# Patient Record
Sex: Female | Born: 1975 | Race: Black or African American | Hispanic: No | Marital: Married | Smoking: Never smoker
Health system: Southern US, Community
[De-identification: ages and names within clinical notes are randomized; demographics above are authoritative.]

## PROBLEM LIST (undated history)

## (undated) DIAGNOSIS — D329 Benign neoplasm of meninges, unspecified: Secondary | ICD-10-CM

## (undated) DIAGNOSIS — K219 Gastro-esophageal reflux disease without esophagitis: Secondary | ICD-10-CM

## (undated) DIAGNOSIS — E119 Type 2 diabetes mellitus without complications: Secondary | ICD-10-CM

## (undated) DIAGNOSIS — G8929 Other chronic pain: Secondary | ICD-10-CM

## (undated) DIAGNOSIS — R519 Headache, unspecified: Secondary | ICD-10-CM

## (undated) DIAGNOSIS — I10 Essential (primary) hypertension: Secondary | ICD-10-CM

## (undated) HISTORY — PX: TUBAL LIGATION: SHX77

## (undated) HISTORY — PX: THYROIDECTOMY, PARTIAL: SHX18

---

## 2009-09-22 DIAGNOSIS — E119 Type 2 diabetes mellitus without complications: Secondary | ICD-10-CM | POA: Insufficient documentation

## 2009-09-22 DIAGNOSIS — E890 Postprocedural hypothyroidism: Secondary | ICD-10-CM | POA: Insufficient documentation

## 2009-09-22 DIAGNOSIS — E049 Nontoxic goiter, unspecified: Secondary | ICD-10-CM | POA: Insufficient documentation

## 2011-10-15 DIAGNOSIS — E890 Postprocedural hypothyroidism: Secondary | ICD-10-CM | POA: Insufficient documentation

## 2011-10-15 DIAGNOSIS — Z6837 Body mass index (BMI) 37.0-37.9, adult: Secondary | ICD-10-CM | POA: Insufficient documentation

## 2011-10-15 DIAGNOSIS — E119 Type 2 diabetes mellitus without complications: Secondary | ICD-10-CM | POA: Insufficient documentation

## 2011-10-15 DIAGNOSIS — G43909 Migraine, unspecified, not intractable, without status migrainosus: Secondary | ICD-10-CM | POA: Insufficient documentation

## 2012-02-18 DIAGNOSIS — G4700 Insomnia, unspecified: Secondary | ICD-10-CM | POA: Insufficient documentation

## 2012-03-19 DIAGNOSIS — N971 Female infertility of tubal origin: Secondary | ICD-10-CM | POA: Insufficient documentation

## 2012-03-19 DIAGNOSIS — E2839 Other primary ovarian failure: Secondary | ICD-10-CM | POA: Insufficient documentation

## 2015-08-18 ENCOUNTER — Emergency Department: Admit: 2015-08-19 | Payer: BLUE CROSS/BLUE SHIELD | Primary: Family Medicine

## 2015-08-18 ENCOUNTER — Inpatient Hospital Stay
Admit: 2015-08-18 | Discharge: 2015-08-19 | Disposition: A | Discharge status: Home / Self Care | Payer: BLUE CROSS/BLUE SHIELD | Attending: Student in an Organized Health Care Education/Training Program

## 2015-08-18 DIAGNOSIS — K529 Noninfective gastroenteritis and colitis, unspecified: Secondary | ICD-10-CM

## 2015-08-18 LAB — HCG URINE, QL. - POC: Pregnancy test,urine (POC): NEGATIVE

## 2015-08-18 MED ORDER — SODIUM CHLORIDE 0.9% BOLUS IV
0.9 % | Freq: Once | INTRAVENOUS | Status: AC
Start: 2015-08-18 — End: 2015-08-18
  Administered 2015-08-18: via INTRAVENOUS

## 2015-08-18 MED ORDER — FAMOTIDINE (PF) 20 MG/2 ML IV
20 mg/2 mL | INTRAVENOUS | Status: AC
Start: 2015-08-18 — End: 2015-08-18
  Administered 2015-08-19: via INTRAVENOUS

## 2015-08-18 MED ORDER — KETOROLAC TROMETHAMINE 30 MG/ML INJECTION
30 mg/mL (1 mL) | INTRAMUSCULAR | Status: AC
Start: 2015-08-18 — End: 2015-08-18
  Administered 2015-08-19: via INTRAVENOUS

## 2015-08-18 MED ORDER — ONDANSETRON (PF) 4 MG/2 ML INJECTION
4 mg/2 mL | INTRAMUSCULAR | Status: AC
Start: 2015-08-18 — End: 2015-08-18
  Administered 2015-08-19: via INTRAVENOUS

## 2015-08-18 MED FILL — ONDANSETRON (PF) 4 MG/2 ML INJECTION: 4 mg/2 mL | INTRAMUSCULAR | Qty: 2

## 2015-08-18 MED FILL — FAMOTIDINE (PF) 20 MG/2 ML IV: 20 mg/2 mL | INTRAVENOUS | Qty: 2

## 2015-08-18 MED FILL — KETOROLAC TROMETHAMINE 30 MG/ML INJECTION: 30 mg/mL (1 mL) | INTRAMUSCULAR | Qty: 1

## 2015-08-18 MED FILL — SODIUM CHLORIDE 0.9 % IV: INTRAVENOUS | Qty: 1000

## 2015-08-18 NOTE — ED Notes (Signed)
I have reviewed discharge instructions with the patient.  The patient verbalized understanding. Time allotted for questions. VSS. Pt ambulatory out of unit with family member.

## 2015-08-18 NOTE — ED Notes (Signed)
Pt ambulatory to restroom with steady gait.

## 2015-08-18 NOTE — ED Triage Notes (Signed)
Pt arrives with abd pain, n/v x 1.5 weeks. [facility] & [facility] to home but pt has gotten no relief.  No diarrhea. Denies fever.

## 2015-08-18 NOTE — ED Provider Notes (Signed)
HPI Comments: The pt is a 40 year old female with a history of DM and HTN who presents with complaints of nausea, vomiting, and LUQ pain for the last 1.5 weeks. Seen at [facility] - normal laboratory data - discharged with Omeprazole. Pt denies improvement in her symptoms. Pt denies fevers, chills, night sweats, chest pressure, SOB, DOE, PND, orthopnea, melena, hematuria, dysuria, constipation, HA, dizziness, and syncope.       Past Medical History:    Diabetes type 2, controlled (HCC)                             H/O partial thyroidectomy                                     Hypertension                                                  Past Surgical History:    HX PARTIAL THYROIDECTOMY                                       PCP:  Sarah Ridgel, MD        Patient is a 40 y.o. female presenting with abdominal pain and nausea. The history is provided by the patient.   Abdominal Pain    This is a new problem. Episode onset: 1.5 weeks ago. The problem occurs constantly. The problem has been gradually worsening. Associated with: n/v. The pain is located in the LUQ. The quality of the pain is shooting. The pain is at a severity of 10/10. The pain is moderate. Associated symptoms include nausea, vomiting and chest pain. Pertinent negatives include no anorexia, no fever, no belching, no diarrhea, no flatus, no hematochezia, no melena, no constipation, no dysuria, no frequency, no hematuria, no headaches, no arthralgias, no myalgias, no trauma and no back pain. The pain is worsened by eating. The pain is relieved by nothing. Her past medical history is significant for DM.   Nausea    Associated symptoms include abdominal pain. Pertinent negatives include no chills, no fever, no diarrhea, no headaches, no arthralgias, no myalgias, no cough and no headaches. Her past medical history is significant for DM.        Past Medical History:   Diagnosis Date   ??? Diabetes type 2, controlled (HCC)    ??? H/O partial thyroidectomy     ??? Hypertension        Past Surgical History:   Procedure Laterality Date   ??? Hx partial thyroidectomy           History reviewed. No pertinent family history.    Social History     Social History   ??? Marital status: MARRIED     Spouse name: N/A   ??? Number of children: N/A   ??? Years of education: N/A     Occupational History   ??? Not on file.     Social History Main Topics   ??? Smoking status: Never Smoker   ??? Smokeless tobacco: Not on file   ??? Alcohol use No   ??? Drug use: Not on  file   ??? Sexual activity: Not on file     Other Topics Concern   ??? Not on file     Social History Narrative   ??? No narrative on file         ALLERGIES: Review of patient's allergies indicates no known allergies.    Review of Systems   Constitutional: Negative for activity change, appetite change, chills, diaphoresis, fatigue, fever and unexpected weight change.   HENT: Negative for congestion, ear pain, rhinorrhea, sinus pressure, sore throat and tinnitus.    Eyes: Negative for photophobia, pain, discharge and visual disturbance.   Respiratory: Negative for apnea, cough, choking, chest tightness, shortness of breath, wheezing and stridor.    Cardiovascular: Positive for chest pain. Negative for palpitations and leg swelling.   Gastrointestinal: Positive for abdominal pain, nausea and vomiting. Negative for anorexia, constipation, diarrhea, flatus, hematochezia and melena.   Endocrine: Negative for polydipsia, polyphagia and polyuria.   Genitourinary: Negative for decreased urine volume, dyspareunia, dysuria, enuresis, flank pain, frequency, hematuria and urgency.   Musculoskeletal: Negative for arthralgias, back pain, gait problem, myalgias and neck pain.   Skin: Negative for color change, pallor, rash and wound.   Allergic/Immunologic: Negative for immunocompromised state.   Neurological: Negative for dizziness, seizures, syncope, weakness, light-headedness and headaches.   Hematological: Does not bruise/bleed easily.    Psychiatric/Behavioral: Negative for agitation and confusion. The patient is not nervous/anxious.        Vitals:    08/18/15 1811   BP: (!) 156/99   Pulse: 89   Resp: 18   Temp: 98.1 ??F (36.7 ??C)   SpO2: 100%   Weight: 102.9 kg (226 lb 12.8 oz)   Height: 5\' 8"  (1.727 m)            Physical Exam   Constitutional: She is oriented to person, place, and time. She appears well-developed and well-nourished. No distress.   HENT:   Head: Normocephalic.   Right Ear: External ear normal.   Left Ear: External ear normal.   Mouth/Throat: Oropharynx is clear and moist. No oropharyngeal exudate.   Eyes: Conjunctivae and EOM are normal. Pupils are equal, round, and reactive to light. Right eye exhibits no discharge. Left eye exhibits no discharge. No scleral icterus.   Neck: Normal range of motion. Neck supple. No JVD present. No tracheal deviation present. No thyromegaly present.   Cardiovascular: Normal rate, regular rhythm, normal heart sounds, intact distal pulses and normal pulses.  PMI is not displaced.  Exam reveals no gallop and no friction rub.    No murmur heard.  Pulmonary/Chest: Effort normal and breath sounds normal. No accessory muscle usage or stridor. No respiratory distress. She has no decreased breath sounds. She has no wheezes. She has no rhonchi. She has no rales. She exhibits no tenderness.   Abdominal: Soft. Bowel sounds are normal. She exhibits no distension and no mass. There is no hepatosplenomegaly or splenomegaly. There is tenderness in the left upper quadrant. There is no rigidity, no rebound, no guarding, no CVA tenderness, no tenderness at McBurney's point and negative Murphy's sign.   Musculoskeletal: Normal range of motion. She exhibits no edema or tenderness.   Lymphadenopathy:     She has no cervical adenopathy.   Neurological: She is alert and oriented to person, place, and time. She displays normal reflexes. No cranial nerve deficit or sensory deficit.  Coordination normal. GCS eye subscore is 4. GCS verbal subscore is 5. GCS motor subscore is 6.  Skin: Skin is warm and dry. No rash noted. She is not diaphoretic. No erythema. No pallor.   Psychiatric: She has a normal mood and affect. Her behavior is normal. Judgment and thought content normal.   Nursing note and vitals reviewed.       MDM  Number of Diagnoses or Management Options  Diagnosis management comments:    * routine laboratory data and UA   * IVF, zofran, pepcid, and Toradol   * [place] abd       Amount and/or Complexity of Data Reviewed  Clinical lab tests: ordered and reviewed  Tests in the radiology section of CPT??: ordered and reviewed  Review and summarize past medical records: yes  Discuss the patient with other providers: yes    Risk of Complications, Morbidity, and/or Mortality  General comments:    - stable, ambulatory pt in NAD    Patient Progress  Patient progress: stable    ED Course       Procedures      CMP:   Lab Results   Component Value Date/Time    NA 138 08/18/2015 06:42 PM    K 3.7 08/18/2015 06:42 PM    CL 100 08/18/2015 06:42 PM    CO2 28 08/18/2015 06:42 PM    AGAP 10 08/18/2015 06:42 PM    GLU 250 (H) 08/18/2015 06:42 PM    BUN 6 08/18/2015 06:42 PM    CREA 0.78 08/18/2015 06:42 PM    GFRAA >60 08/18/2015 06:42 PM    GFRNA >60 08/18/2015 06:42 PM    CA 9.1 08/18/2015 06:42 PM    ALB 3.6 08/18/2015 06:42 PM    TP 8.6 (H) 08/18/2015 06:42 PM    GLOB 5.0 (H) 08/18/2015 06:42 PM    AGRAT 0.7 (L) 08/18/2015 06:42 PM    SGOT 14 (L) 08/18/2015 06:42 PM    ALT 17 08/18/2015 06:42 PM     CBC:   Lab Results   Component Value Date/Time    WBC 12.5 (H) 08/18/2015 06:42 PM    HGB 12.6 08/18/2015 06:42 PM    HCT 37.0 08/18/2015 06:42 PM    PLT 201 08/18/2015 06:42 PM     All Cardiac Markers in the last 24 hours:   Lab Results   Component Value Date/Time    TROIQ <0.04 08/18/2015 06:42 PM     Liver Panel:   Lab Results   Component Value Date/Time    ALB 3.6 08/18/2015 06:42 PM     TP 8.6 (H) 08/18/2015 06:42 PM    GLOB 5.0 (H) 08/18/2015 06:42 PM    AGRAT 0.7 (L) 08/18/2015 06:42 PM    SGOT 14 (L) 08/18/2015 06:42 PM    ALT 17 08/18/2015 06:42 PM    AP 105 08/18/2015 06:42 PM     Pancreatic Markers:   Lab Results   Component Value Date/Time    LPSE 185 08/18/2015 06:42 PM        8:01 PM  Change of shift.  Care of patient signed over to April Foust - Ward, PA with an abdominal US pending.  Handoff complete.      Daryll Drown, NP  8:01 PM

## 2015-08-19 LAB — METABOLIC PANEL, COMPREHENSIVE
A-G Ratio: 0.7 — ABNORMAL LOW (ref 1.1–2.2)
ALT (SGPT): 17 U/L (ref 12–78)
AST (SGOT): 14 U/L — ABNORMAL LOW (ref 15–37)
Albumin: 3.6 g/dL (ref 3.5–5.0)
Alk. phosphatase: 105 U/L (ref 45–117)
Anion gap: 10 mmol/L (ref 5–15)
BUN/Creatinine ratio: 8 — ABNORMAL LOW (ref 12–20)
BUN: 6 MG/DL (ref 6–20)
Bilirubin, total: 0.7 MG/DL (ref 0.2–1.0)
CO2: 28 mmol/L (ref 21–32)
Calcium: 9.1 MG/DL (ref 8.5–10.1)
Chloride: 100 mmol/L (ref 97–108)
Creatinine: 0.78 MG/DL (ref 0.55–1.02)
GFR est AA: >60 mL/min/{1.73_m2} (ref >60)
GFR est non-AA: >60 mL/min/{1.73_m2} (ref >60)
Globulin: 5 g/dL — ABNORMAL HIGH (ref 2.0–4.0)
Glucose: 250 mg/dL — ABNORMAL HIGH (ref 65–100)
Potassium: 3.7 mmol/L (ref 3.5–5.1)
Protein, total: 8.6 g/dL — ABNORMAL HIGH (ref 6.4–8.2)
Sodium: 138 mmol/L (ref 136–145)

## 2015-08-19 LAB — URINALYSIS W/ REFLEX CULTURE
Bilirubin: NEGATIVE
Blood: NEGATIVE
Glucose: >1000 mg/dL — AB
Ketone: 40 mg/dL — AB
Nitrites: NEGATIVE
Protein: 30 mg/dL — AB
Specific gravity: 1.024 (ref 1.003–1.030)
Urobilinogen: 0.2 EU/dL (ref 0.2–1.0)
pH (UA): 6.5 (ref 5.0–8.0)

## 2015-08-19 LAB — CBC WITH AUTOMATED DIFF
ABS. BASOPHILS: 0 10*3/uL (ref 0.0–0.1)
ABS. EOSINOPHILS: 0.1 10*3/uL (ref 0.0–0.4)
ABS. LYMPHOCYTES: 1.4 10*3/uL (ref 0.8–3.5)
ABS. MONOCYTES: 0.7 10*3/uL (ref 0.0–1.0)
ABS. NEUTROPHILS: 10.3 10*3/uL — ABNORMAL HIGH (ref 1.8–8.0)
BASOPHILS: 0 % (ref 0–1)
EOSINOPHILS: 1 % (ref 0–7)
HCT: 37 % (ref 35.0–47.0)
HGB: 12.6 g/dL (ref 11.5–16.0)
LYMPHOCYTES: 11 % — ABNORMAL LOW (ref 12–49)
MCH: 29 PG (ref 26.0–34.0)
MCHC: 34.1 g/dL (ref 30.0–36.5)
MCV: 85.3 FL (ref 80.0–99.0)
MONOCYTES: 6 % (ref 5–13)
NEUTROPHILS: 82 % — ABNORMAL HIGH (ref 32–75)
PLATELET: 201 10*3/uL (ref 150–400)
RBC: 4.34 M/uL (ref 3.80–5.20)
RDW: 12.5 % (ref 11.5–14.5)
WBC: 12.5 10*3/uL — ABNORMAL HIGH (ref 3.6–11.0)

## 2015-08-19 LAB — TROPONIN I: Troponin-I, Qt.: <0.04 ng/mL (ref <0.05)

## 2015-08-19 LAB — EKG, 12 LEAD, INITIAL
Atrial Rate: 84 {beats}/min
Calculated P Axis: 49 degrees
Calculated R Axis: -5 degrees
Calculated T Axis: 32 degrees
Diagnosis: NORMAL
P-R Interval: 134 ms
Q-T Interval: 382 ms
QRS Duration: 88 ms
QTC Calculation (Bezet): 451 ms
Ventricular Rate: 84 {beats}/min

## 2015-08-19 LAB — LIPASE: Lipase: 185 U/L (ref 73–393)

## 2015-08-19 MED ORDER — SUCRALFATE 100 MG/ML ORAL SUSP
100 mg/mL | Freq: Four times a day (QID) | ORAL | 0 refills | Status: AC
Start: 2015-08-19 — End: ?

## 2015-08-19 MED ORDER — SODIUM CHLORIDE 0.9% BOLUS IV
0.9 % | Freq: Once | INTRAVENOUS | Status: AC
Start: 2015-08-19 — End: 2015-08-18
  Administered 2015-08-19: 01:00:00 via INTRAVENOUS

## 2015-08-19 MED ORDER — ONDANSETRON 4 MG TAB, RAPID DISSOLVE
4 mg | ORAL_TABLET | Freq: Three times a day (TID) | ORAL | 0 refills | Status: AC | PRN
Start: 2015-08-19 — End: ?

## 2015-08-19 MED ORDER — LIDOCAINE 2 % MUCOSAL SOLN
2 % | Freq: Once | Status: AC
Start: 2015-08-19 — End: 2015-08-18
  Administered 2015-08-19: 01:00:00 via ORAL

## 2015-08-19 MED FILL — MAG-AL PLUS 200 MG-200 MG-20 MG/5 ML ORAL SUSPENSION: 200-200-20 mg/5 mL | ORAL | Qty: 30

## 2015-08-19 MED FILL — SODIUM CHLORIDE 0.9 % IV: INTRAVENOUS | Qty: 1000

## 2015-08-20 LAB — CULTURE, URINE
Colonies Counted: 50000
Colony Count: 50000

## 2017-08-20 DIAGNOSIS — I10 Essential (primary) hypertension: Secondary | ICD-10-CM | POA: Insufficient documentation

## 2017-12-30 DIAGNOSIS — M25561 Pain in right knee: Secondary | ICD-10-CM | POA: Insufficient documentation

## 2019-04-09 ENCOUNTER — Ambulatory Visit (INDEPENDENT_AMBULATORY_CARE_PROVIDER_SITE_OTHER): Payer: Self-pay | Admitting: Bariatrics

## 2019-04-22 ENCOUNTER — Emergency Department (HOSPITAL_COMMUNITY): Payer: Managed Care, Other (non HMO)

## 2019-04-22 ENCOUNTER — Emergency Department (HOSPITAL_COMMUNITY)
Admission: EM | Admit: 2019-04-22 | Discharge: 2019-04-22 | Disposition: A | Discharge status: Home / Self Care | Payer: Managed Care, Other (non HMO) | Attending: Emergency Medicine | Admitting: Emergency Medicine

## 2019-04-22 ENCOUNTER — Encounter (HOSPITAL_COMMUNITY): Payer: Self-pay | Admitting: Family Medicine

## 2019-04-22 ENCOUNTER — Other Ambulatory Visit: Payer: Self-pay

## 2019-04-22 DIAGNOSIS — Z79899 Other long term (current) drug therapy: Secondary | ICD-10-CM | POA: Diagnosis not present

## 2019-04-22 DIAGNOSIS — E119 Type 2 diabetes mellitus without complications: Secondary | ICD-10-CM | POA: Diagnosis not present

## 2019-04-22 DIAGNOSIS — Z794 Long term (current) use of insulin: Secondary | ICD-10-CM | POA: Insufficient documentation

## 2019-04-22 DIAGNOSIS — R519 Headache, unspecified: Secondary | ICD-10-CM

## 2019-04-22 DIAGNOSIS — R51 Headache: Secondary | ICD-10-CM | POA: Diagnosis present

## 2019-04-22 DIAGNOSIS — R110 Nausea: Secondary | ICD-10-CM | POA: Insufficient documentation

## 2019-04-22 DIAGNOSIS — D329 Benign neoplasm of meninges, unspecified: Secondary | ICD-10-CM | POA: Insufficient documentation

## 2019-04-22 DIAGNOSIS — I10 Essential (primary) hypertension: Secondary | ICD-10-CM | POA: Diagnosis not present

## 2019-04-22 HISTORY — DX: Type 2 diabetes mellitus without complications: E11.9

## 2019-04-22 HISTORY — DX: Headache, unspecified: R51.9

## 2019-04-22 HISTORY — DX: Essential (primary) hypertension: I10

## 2019-04-22 HISTORY — DX: Other chronic pain: G89.29

## 2019-04-22 LAB — BASIC METABOLIC PANEL
Anion gap: 7 (ref 5–15)
BUN: 9 mg/dL (ref 6–20)
CO2: 25 mmol/L (ref 22–32)
Calcium: 8.6 mg/dL — ABNORMAL LOW (ref 8.9–10.3)
Chloride: 106 mmol/L (ref 98–111)
Creatinine, Ser: 0.84 mg/dL (ref 0.44–1.00)
GFR calc Af Amer: >60 mL/min (ref >60)
GFR calc non Af Amer: >60 mL/min (ref >60)
Glucose, Bld: 268 mg/dL — ABNORMAL HIGH (ref 70–99)
Potassium: 3.5 mmol/L (ref 3.5–5.1)
Sodium: 138 mmol/L (ref 135–145)

## 2019-04-22 LAB — CBC WITH DIFFERENTIAL/PLATELET
Abs Immature Granulocytes: 0.02 10*3/uL (ref 0.00–0.07)
Basophils Absolute: 0.1 10*3/uL (ref 0.0–0.1)
Basophils Relative: 1 %
Eosinophils Absolute: 0.3 10*3/uL (ref 0.0–0.5)
Eosinophils Relative: 3 %
HCT: 35.5 % — ABNORMAL LOW (ref 36.0–46.0)
Hemoglobin: 11.3 g/dL — ABNORMAL LOW (ref 12.0–15.0)
Immature Granulocytes: 0 %
Lymphocytes Relative: 25 %
Lymphs Abs: 2 10*3/uL (ref 0.7–4.0)
MCH: 27.6 pg (ref 26.0–34.0)
MCHC: 31.8 g/dL (ref 30.0–36.0)
MCV: 86.8 fL (ref 80.0–100.0)
Monocytes Absolute: 0.5 10*3/uL (ref 0.1–1.0)
Monocytes Relative: 6 %
Neutro Abs: 5.2 10*3/uL (ref 1.7–7.7)
Neutrophils Relative %: 65 %
Platelets: 213 10*3/uL (ref 150–400)
RBC: 4.09 MIL/uL (ref 3.87–5.11)
RDW: 12.1 % (ref 11.5–15.5)
WBC: 8 10*3/uL (ref 4.0–10.5)
nRBC: 0 % (ref 0.0–0.2)

## 2019-04-22 LAB — HCG, QUANTITATIVE, PREGNANCY: hCG, Beta Chain, Quant, S: 1 m[IU]/mL (ref <5)

## 2019-04-22 MED ORDER — GADOBUTROL 1 MMOL/ML IV SOLN
10.0000 mL | Freq: Once | INTRAVENOUS | Status: AC | PRN
  Administered 2019-04-22: 10 mL via INTRAVENOUS

## 2019-04-22 MED ORDER — KETOROLAC TROMETHAMINE 15 MG/ML IJ SOLN
15.0000 mg | Freq: Once | INTRAMUSCULAR | Status: AC
  Administered 2019-04-22: 15 mg via INTRAVENOUS
  Filled 2019-04-22: qty 1

## 2019-04-22 MED ORDER — METOCLOPRAMIDE HCL 5 MG/ML IJ SOLN
10.0000 mg | Freq: Once | INTRAMUSCULAR | Status: AC
  Administered 2019-04-22: 10 mg via INTRAVENOUS
  Filled 2019-04-22: qty 2

## 2019-04-22 MED ORDER — SODIUM CHLORIDE (PF) 0.9 % IJ SOLN
INTRAMUSCULAR | Status: AC
  Administered 2019-04-22: 15:00:00
  Filled 2019-04-22: qty 50

## 2019-04-22 MED ORDER — IOHEXOL 350 MG/ML SOLN
100.0000 mL | Freq: Once | INTRAVENOUS | Status: AC | PRN
  Administered 2019-04-22: 100 mL via INTRAVENOUS

## 2019-04-22 MED ORDER — DIPHENHYDRAMINE HCL 50 MG/ML IJ SOLN
12.5000 mg | Freq: Once | INTRAMUSCULAR | Status: AC
  Administered 2019-04-22: 12.5 mg via INTRAVENOUS
  Filled 2019-04-22: qty 1

## 2019-04-22 NOTE — ED Provider Notes (Signed)
  Physical Exam  BP (!) 166/96   Pulse 79   Temp 98.2 F (36.8 C) (Oral)   Resp 14   Ht 5\' 8"  (1.727 m)   Wt 99.8 kg   LMP 04/06/2019   SpO2 98%   BMI 33.45 kg/m   Physical Exam  gen: appears nontoxic Neuro: no obvious neuro deficits  ED Course/Procedures     Procedures  MDM   Patient signed out to me by Sharlene Motts, PA-C.  Please see previous notes for further history.  In brief, patient presenting for evaluation of worsening and increased frequency of headaches.  Headaches are slightly different from her normal migraines.  Had a tele-visit yesterday, was called in [place] and had no improvement of her symptoms.  As such, she came to the ER.  Headache improved with headache cocktail, however CT shows brain mass, likely meningioma.  Recommending MRI with contrast for further characterization.  MRI pending.  MRI consistent with meningioma.  Per MRI, lesion is pressing against the basilar artery, but no mass-effect.  Will consult with neurosurgery.  Discussed with Dr. [place] Halsted from neurosurgery who recommends CTA.  If normal, patient can follow-up in the clinic outpatient.  CTA negative for obstruction.  Discussed findings with patient.  Discussed continued symptomatic treatment for her headaches and follow-up with neurosurgery.  At this time, patient present for discharge.  Return precautions given.  Patient states she understands and agrees to plan.           Franchot Heidelberg, PA-C 04/22/19 1536    Daleen Bo, MD 04/22/19 1635

## 2019-04-22 NOTE — Discharge Instructions (Addendum)
Continue treating your headaches as you would your migraines--with Tylenol, ibuprofen, triptans. Make sure you stay well-hydrated water. Follow-up with the neurosurgeon listed below for further management of your meningioma. Return to the emergency room with any new, worsening, concerning symptoms.

## 2019-04-22 NOTE — ED Provider Notes (Signed)
[facility] DEPT Provider Note   CSN: [id number] Arrival date & time: 04/22/19  0211     History   Chief Complaint Chief Complaint  Patient presents with  . Headache    HPI Tricia Soto is a 43 y.o. female.     Patient with history of migraine headaches presents to ED with concern for increasing frequency of significant headaches x 3 weeks. She states her migraines are usually posterior and bilateral, and her recent headaches are left sided. She reports headaches are usually controlled with ibuprofen but she has had no relief with this. No fever. She denies photophobia. She was seen yesterday via telehealth and prescribed Fioricet but reports she has not received any relief with this medication either. She is concerned the frequency and difficulty resolving the current headache represents a new problem.   The history is provided by the patient and the spouse. No language interpreter was used.  Headache Associated symptoms: nausea   Associated symptoms: no fever and no photophobia     Past Medical History:  Diagnosis Date  . Chronic headaches   . Diabetes mellitus without complication ([place])   . Hypertension     There are no active problems to display for this patient.   Past Surgical History:  Procedure Laterality Date  . THYROIDECTOMY, PARTIAL       OB History   No obstetric history on file.      Home Medications    Prior to Admission medications   Medication Sig Start Date End Date Taking? Authorizing Provider  Butalbital-APAP-Caffeine 50-325-40 MG capsule Take 1 capsule by mouth every 6 (six) hours as needed for headache. 04/21/19 05/01/19 Yes [provider]  hydrOXYzine (ATARAX/VISTARIL) 10 MG tablet Take 10 mg by mouth 3 (three) times daily as needed for anxiety.   Yes [provider]  Ibuprofen (ADVIL MIGRAINE) 200 MG CAPS Take 200 mg by mouth daily as needed (pain).   Yes [provider]  Liraglutide  -Weight Management (SAXENDA) 18 MG/3ML SOPN Inject 3 mg into the skin. 02/17/19  Yes [provider]  losartan (COZAAR) 25 MG tablet Take 25 mg by mouth daily. 04/13/19  Yes [provider]  phentermine (ADIPEX-P) 37.5 MG tablet Take 37.5 mg by mouth daily before breakfast.   Yes [provider]  SUMAtriptan (IMITREX) 100 MG tablet Take 100 mg by mouth See admin instructions. Take once as needed for migraine. May repeat dose in 1 hour if no improvement. 04/08/19  Yes [provider]  TRESIBA FLEXTOUCH 100 UNIT/ML SOPN FlexTouch Pen Inject 20 Units into the skin daily. 11/18/18  Yes [provider]    Family History History reviewed. No pertinent family history.  Social History Social History   Tobacco Use  . Smoking status: Never Smoker  . Smokeless tobacco: Never Used  Substance Use Topics  . Alcohol use: Not Currently  . Drug use: Never     Allergies   Lisinopril   Review of Systems Review of Systems  Constitutional: Negative for chills and fever.  HENT: Negative.   Eyes: Negative for photophobia.  Respiratory: Negative.   Cardiovascular: Negative.   Gastrointestinal: Positive for nausea.  Musculoskeletal: Negative.   Skin: Negative.   Neurological: Positive for headaches.     Physical Exam Updated Vital Signs BP (!) 177/101 (BP Location: Right Arm)   Pulse 88   Temp 98.2 F (36.8 C) (Oral)   Resp 16   Ht 5\' 8"  (1.727 m)  Wt 99.8 kg   LMP 04/06/2019   SpO2 100%   BMI 33.45 kg/m   Physical Exam Vitals signs and nursing note reviewed.  Constitutional:      Appearance: She is well-developed.  HENT:     Head: Normocephalic.  Neck:     Musculoskeletal: Normal range of motion and neck supple.  Cardiovascular:     Rate and Rhythm: Normal rate and regular rhythm.  Pulmonary:     Effort: Pulmonary effort is normal.     Breath sounds: Normal breath sounds.  Abdominal:     General: Bowel sounds are normal.      Palpations: Abdomen is soft.     Tenderness: There is no abdominal tenderness. There is no guarding or rebound.  Musculoskeletal: Normal range of motion.  Skin:    General: Skin is warm and dry.     Findings: No rash.  Neurological:     Mental Status: She is alert.     Comments: CN's 3-12 grossly intact. Speech is clear and focused. No facial asymmetry. No lateralizing weakness. Reflexes are equal. No deficits of coordination. Ambulatory without imbalance.        ED Treatments / Results  Labs (all labs ordered are listed, but only abnormal results are displayed) Labs Reviewed - No data to display  EKG None  Radiology No results found.  Procedures Procedures (including critical care time)  Medications Ordered in ED Medications  metoCLOPramide (REGLAN) injection 10 mg (has no administration in time range)  diphenhydrAMINE (BENADRYL) injection 12.5 mg (has no administration in time range)  ketorolac (TORADOL) 15 MG/ML injection 15 mg (has no administration in time range)     Initial Impression / Assessment and Plan / ED Course  I have reviewed the triage vital signs and the nursing notes.  Pertinent labs & imaging results that were available during my care of the patient were reviewed by me and considered in my medical decision making (see chart for details).        Patient to ED with complaint of frequent, intense left sided headaches x 3 weeks. H/o migraines that follow a different pattern. Regular medications that usually resolve her headaches are not beneficial.   Patient has no neuro deficits on exam. VSS, mildly elevated BP with h/o HTN. Will provide headache cocktail for relief of headache. Will do head CT for evaluation of new headache.   Head CT shows a large, 18 x 8 mm mass on left petrous, likely meningioma. Recommen MRI with CM, which has been ordered.   The patient and husband have been updated on findings. All questions answered. Headache pain completely  relieved with IV medications.   Patient care signed out to [place], PA-C, pending MRI results.   Final Clinical Impressions(s) / ED Diagnoses   Final diagnoses:  None   1. Headache 2. Brain mass  ED Discharge Orders    None       Charlann Lange, PA-C 04/22/19 [id number]    Shanon Rosser, MD 04/22/19 [phone]

## 2019-04-22 NOTE — ED Triage Notes (Signed)
Patient is complaining of left sided headache that started 3 weeks ago. Associated symptoms of dizziness and nausea. Patient has took [place] 11:30.

## 2019-04-22 NOTE — ED Notes (Signed)
Nurse Secretary made aware that patient is requiring MRI.

## 2019-04-22 NOTE — ED Notes (Signed)
Patient transported to MRI 

## 2019-04-29 ENCOUNTER — Ambulatory Visit (INDEPENDENT_AMBULATORY_CARE_PROVIDER_SITE_OTHER): Payer: Self-pay | Admitting: Bariatrics

## 2019-04-29 ENCOUNTER — Other Ambulatory Visit: Payer: Self-pay | Admitting: Neurological Surgery

## 2019-05-13 NOTE — Pre-Procedure Instructions (Addendum)
[place], [place] [address] Phone: [phone] Fax: [phone]    Your procedure is scheduled on Tues., Oct. 13, 2020 from 7:30AM-2:27PM  Report to [facility] Entrance "A" at 5:30AM  Call this number if you have problems the morning of surgery:  [phone]   Remember:  Do not eat or drink after midnight on Oct. 12th    Take these medicines the morning of surgery with A SIP OF WATER:NONE  As of today, stop taking Liraglutide -Weight Management (SAXENDA) and Phentermine (ADIPEX-P), until instructed by your surgeon.  7 days before surgery (05/18/19), stop taking all Aspirin (unless instructed by your doctor) and Other Aspirin containing products, Vitamins, Fish oils, and Herbal medications. Also stop all NSAIDS i.e. Advil, Ibuprofen, Motrin, Aleve, Anaprox, Naproxen, BC, Goody Powders, and all Supplements.    . THE MORNING OF SURGERY, take _____5________ units of ____Tresiba______insulin.   How to Manage Your Diabetes Before and After Surgery  Why is it important to control my blood sugar before and after surgery? . Improving blood sugar levels before and after surgery helps healing and can limit problems. . A way of improving blood sugar control is eating a healthy diet by: o  Eating less sugar and carbohydrates o  Increasing activity/exercise o  Talking with your doctor about reaching your blood sugar goals . High blood sugars (greater than 180 mg/dL) can raise your risk of infections and slow your recovery, so you will need to focus on controlling your diabetes during the weeks before surgery. . Make sure that the doctor who takes care of your diabetes knows about your planned surgery including the date and location.  How do I manage my blood sugar before surgery? . Check your blood sugar at least 4 times a day, starting 2 days before surgery, to make sure that the level is not too  high or low. o Check your blood sugar the morning of your surgery when you wake up and every 2 hours until you get to the Short Stay unit. . If your blood sugar is less than 70 mg/dL, you will need to treat for low blood sugar: o Do not take insulin. o Treat a low blood sugar (less than 70 mg/dL) with  cup of clear juice (cranberry or apple), 4 glucose tablets, OR glucose gel. Recheck blood sugar in 15 minutes after treatment (to make sure it is greater than 70 mg/dL). If your blood sugar is not greater than 70 mg/dL on recheck, call [phone] o  for further instructions.                                . If your CBG is greater than 220 mg/dL, inform the staff upon arrival to Short Stay.  . If you are admitted to the hospital after surgery: o Your blood sugar will be checked by the staff and you will probably be given insulin after surgery (instead of oral diabetes medicines) to make sure you have good blood sugar levels. o The goal for blood sugar control after surgery is 80-180 mg/dL.  Reviewed and Endorsed by [facility] Patient Education Committee, August 2015   Special instructions:   [facility]- Preparing For Surgery  Before surgery, you can play an important role. Because skin is not sterile, your skin needs to be as free of germs  as possible. You can reduce the number of germs on your skin by washing with CHG (chlorahexidine gluconate) Soap before surgery.  CHG is an antiseptic cleaner which kills germs and bonds with the skin to continue killing germs even after washing.    Please do not use if you have an allergy to CHG or antibacterial soaps. If your skin becomes reddened/irritated stop using the CHG.  Do not shave (including legs and underarms) for at least 48 hours prior to first CHG shower. It is OK to shave your face.  No Tobacco or Alcohol products for 24 hours prior to your procedure.  Please follow these instructions carefully.   1. Shower the NIGHT BEFORE SURGERY  and the MORNING OF SURGERY with CHG.   2. If you chose to wash your hair, wash your hair first as usual with your normal shampoo.  3. After you shampoo, rinse your hair and body thoroughly to remove the shampoo.  4. Use CHG as you would any other liquid soap. You can apply CHG directly to the skin and wash gently with a scrungie or a clean washcloth.   5. Apply the CHG Soap to your body ONLY FROM THE NECK DOWN.  Do not use on open wounds or open sores. Avoid contact with your eyes, ears, mouth and genitals (private parts). Wash Face and genitals (private parts)  with your normal soap.  6. Wash thoroughly, paying special attention to the area where your surgery will be performed.  7. Thoroughly rinse your body with warm water from the neck down.  8. DO NOT shower/wash with your normal soap after using and rinsing off the CHG Soap.  9. Pat yourself dry with a CLEAN TOWEL.  10. Wear CLEAN PAJAMAS to bed the night before surgery, wear comfortable clothes the morning of surgery  11. Place CLEAN SHEETS on your bed the night of your first shower and DO NOT SLEEP WITH PETS.   Day of Surgery:             Remember to brush your teeth WITH YOUR REGULAR TOOTHPASTE.   Do not wear jewelry, make-up or nail polish.  Do not wear lotions, powders, or perfumes, or deodorant.  Do not shave 48 hours prior to surgery.    Do not bring valuables to the hospital.  [facility] is not responsible for any belongings or valuables.  Contacts, dentures or bridgework may not be worn into surgery.    For patients admitted to the hospital, discharge time will be determined by your treatment team.  Patients discharged the day of surgery will not be allowed to drive home, and someone age 73 or over has to stay with them for 24 hours.  Please wear clean clothes to the hospital/surgery center.     Please read over the following fact sheets that you were given. Pain Booklet, Coughing and Deep Breathing and  Surgical Site Infection Prevention

## 2019-05-14 ENCOUNTER — Other Ambulatory Visit: Payer: Self-pay

## 2019-05-14 ENCOUNTER — Encounter (HOSPITAL_COMMUNITY): Payer: Self-pay

## 2019-05-14 ENCOUNTER — Encounter (HOSPITAL_COMMUNITY)
Admission: RE | Admit: 2019-05-14 | Discharge: 2019-05-14 | Disposition: A | Discharge status: Home / Self Care | Payer: Managed Care, Other (non HMO) | Source: Ambulatory Visit | Attending: Neurological Surgery | Admitting: Neurological Surgery

## 2019-05-14 DIAGNOSIS — Z01818 Encounter for other preprocedural examination: Secondary | ICD-10-CM | POA: Diagnosis not present

## 2019-05-14 DIAGNOSIS — I10 Essential (primary) hypertension: Secondary | ICD-10-CM | POA: Insufficient documentation

## 2019-05-14 DIAGNOSIS — E119 Type 2 diabetes mellitus without complications: Secondary | ICD-10-CM | POA: Insufficient documentation

## 2019-05-14 HISTORY — DX: Gastro-esophageal reflux disease without esophagitis: K21.9

## 2019-05-14 HISTORY — DX: Benign neoplasm of meninges, unspecified: D32.9

## 2019-05-14 LAB — CBC
HCT: 35.9 % — ABNORMAL LOW (ref 36.0–46.0)
Hemoglobin: 12.1 g/dL (ref 12.0–15.0)
MCH: 29.3 pg (ref 26.0–34.0)
MCHC: 33.7 g/dL (ref 30.0–36.0)
MCV: 86.9 fL (ref 80.0–100.0)
Platelets: 224 10*3/uL (ref 150–400)
RBC: 4.13 MIL/uL (ref 3.87–5.11)
RDW: 11.9 % (ref 11.5–15.5)
WBC: 7.9 10*3/uL (ref 4.0–10.5)
nRBC: 0 % (ref 0.0–0.2)

## 2019-05-14 LAB — BASIC METABOLIC PANEL
Anion gap: 6 (ref 5–15)
BUN: 9 mg/dL (ref 6–20)
CO2: 28 mmol/L (ref 22–32)
Calcium: 9.2 mg/dL (ref 8.9–10.3)
Chloride: 106 mmol/L (ref 98–111)
Creatinine, Ser: 0.83 mg/dL (ref 0.44–1.00)
GFR calc Af Amer: >60 mL/min (ref >60)
GFR calc non Af Amer: >60 mL/min (ref >60)
Glucose, Bld: 110 mg/dL — ABNORMAL HIGH (ref 70–99)
Potassium: 3.6 mmol/L (ref 3.5–5.1)
Sodium: 140 mmol/L (ref 135–145)

## 2019-05-14 LAB — HEMOGLOBIN A1C
Hgb A1c MFr Bld: 12.8 % — ABNORMAL HIGH (ref 4.8–5.6)
Mean Plasma Glucose: 320.66 mg/dL

## 2019-05-14 LAB — TYPE AND SCREEN
ABO/RH(D): O POS
Antibody Screen: NEGATIVE

## 2019-05-14 LAB — ABO/RH: ABO/RH(D): O POS

## 2019-05-14 LAB — GLUCOSE, CAPILLARY: Glucose-Capillary: 175 mg/dL — ABNORMAL HIGH (ref 70–99)

## 2019-05-14 NOTE — Pre-Procedure Instructions (Signed)
[place], [place] [address] Phone: [phone] Fax: [phone]    Your procedure is scheduled on Tues., Oct. 13, 2020 from 7:30AM-2:27PM  Report to [facility] Entrance "A" at 5:30AM  Call this number if you have problems the morning of surgery:  [phone]   Remember:  Do not eat or drink after midnight on Oct. 12th    Take these medicines the morning of surgery with A SIP OF WATER:NONE  As of today, stop taking Phentermine (ADIPEX-P), until instructed by your surgeon.  7 days before surgery (05/18/19), stop taking all Aspirin (unless instructed by your doctor) and Other Aspirin containing products, Vitamins, Fish oils, and Herbal medications. Also stop all NSAIDS i.e. Advil, Ibuprofen, Motrin, Aleve, Anaprox, Naproxen, BC, Goody Powders, and all Supplements.   THE NIGHT BEFORE SURGERY, take _____10________ units of ____Tresiba______insulin THE MORNING OF SURGERY, take _____10________ units of ____Tresiba______insulin.  . Do not take Liraglutide -Weight Management (SAXENDA)  The day of surgery   How to Manage Your Diabetes Before and After Surgery  Why is it important to control my blood sugar before and after surgery? . Improving blood sugar levels before and after surgery helps healing and can limit problems. . A way of improving blood sugar control is eating a healthy diet by: o  Eating less sugar and carbohydrates o  Increasing activity/exercise o  Talking with your doctor about reaching your blood sugar goals . High blood sugars (greater than 180 mg/dL) can raise your risk of infections and slow your recovery, so you will need to focus on controlling your diabetes during the weeks before surgery. . Make sure that the doctor who takes care of your diabetes knows about your planned surgery including the date and location.  How do I manage my blood sugar before surgery? . Check  your blood sugar at least 4 times a day, starting 2 days before surgery, to make sure that the level is not too high or low. o Check your blood sugar the morning of your surgery when you wake up and every 2 hours until you get to the Short Stay unit. . If your blood sugar is less than 70 mg/dL, you will need to treat for low blood sugar: o Do not take insulin. o Treat a low blood sugar (less than 70 mg/dL) with  cup of clear juice (cranberry or apple), 4 glucose tablets, OR glucose gel. Recheck blood sugar in 15 minutes after treatment (to make sure it is greater than 70 mg/dL). If your blood sugar is not greater than 70 mg/dL on recheck, call [phone] o  for further instructions.                                . If your CBG is greater than 220 mg/dL, inform the staff upon arrival to Short Stay.  . If you are admitted to the hospital after surgery: o Your blood sugar will be checked by the staff and you will probably be given insulin after surgery (instead of oral diabetes medicines) to make sure you have good blood sugar levels. o The goal for blood sugar control after surgery is 80-180 mg/dL.  Reviewed and Endorsed by [facility] Patient Education Committee, August 2015   Special instructions:   [facility]- Preparing For Surgery  Before surgery, you can play an  important role. Because skin is not sterile, your skin needs to be as free of germs as possible. You can reduce the number of germs on your skin by washing with CHG (chlorahexidine gluconate) Soap before surgery.  CHG is an antiseptic cleaner which kills germs and bonds with the skin to continue killing germs even after washing.    Please do not use if you have an allergy to CHG or antibacterial soaps. If your skin becomes reddened/irritated stop using the CHG.  Do not shave (including legs and underarms) for at least 48 hours prior to first CHG shower. It is OK to shave your face.  No Tobacco or Alcohol products for 24 hours  prior to your procedure.  Please follow these instructions carefully.   1. Shower the NIGHT BEFORE SURGERY and the MORNING OF SURGERY with CHG.   2. If you chose to wash your hair, wash your hair first as usual with your normal shampoo.  3. After you shampoo, rinse your hair and body thoroughly to remove the shampoo.  4. Use CHG as you would any other liquid soap. You can apply CHG directly to the skin and wash gently with a scrungie or a clean washcloth.   5. Apply the CHG Soap to your body ONLY FROM THE NECK DOWN.  Do not use on open wounds or open sores. Avoid contact with your eyes, ears, mouth and genitals (private parts). Wash Face and genitals (private parts)  with your normal soap.  6. Wash thoroughly, paying special attention to the area where your surgery will be performed.  7. Thoroughly rinse your body with warm water from the neck down.  8. DO NOT shower/wash with your normal soap after using and rinsing off the CHG Soap.  9. Pat yourself dry with a CLEAN TOWEL.  10. Wear CLEAN PAJAMAS to bed the night before surgery, wear comfortable clothes the morning of surgery  11. Place CLEAN SHEETS on your bed the night of your first shower and DO NOT SLEEP WITH PETS.   Day of Surgery:             Remember to brush your teeth WITH YOUR REGULAR TOOTHPASTE.   Do not wear jewelry, make-up or nail polish.  Do not wear lotions, powders, or perfumes, or deodorant.  Do not shave 48 hours prior to surgery.    Do not bring valuables to the hospital.  [facility] is not responsible for any belongings or valuables.  Contacts, dentures or bridgework may not be worn into surgery.    For patients admitted to the hospital, discharge time will be determined by your treatment team.  Patients discharged the day of surgery will not be allowed to drive home, and someone age 30 or over has to stay with them for 24 hours.  Please wear clean clothes to the hospital/surgery center.     Please  read over the following fact sheets that you were given. Pain Booklet, Coughing and Deep Breathing and Surgical Site Infection Prevention

## 2019-05-14 NOTE — Progress Notes (Addendum)
PCP - Timmie Foerster, DO Cardiologist - Denies Endocrinologist- Bonney Aid, MD  PPM/ICD - N/A Device Orders - N/A Rep Notified N/A  Chest x-ray - N/A EKG - 10//08/2018 Stress Test - Denies ECHO - Denies Cardiac Cath - Denies  Sleep Study - Denies CPAP - N/A  Fasting Blood Sugar  140-180 Checks Blood Sugar __1-2__ times a day  Blood Thinner Instructions: N/A Aspirin Instructions: N/A Stop Taking Adipex as of today 05/14/2019. Last dose 05/13/2019.  ERAS Protcol - No PRE-SURGERY Ensure - N/A  COVID TEST- 05/21/2019   Anesthesia review: Yes, Hgb A1C 12.8  Patient denies shortness of breath, fever, cough and chest pain at PAT appointment   Coronavirus Screening  Have you experienced the following symptoms:  Cough yes/no: No Fever (>100.84F)  yes/no: No Runny nose yes/no: No Sore throat yes/no: No Difficulty breathing/shortness of breath  yes/no: No  Have you or a family member traveled in the last 14 days and where? yes/no: No   If the patient indicates "YES" to the above questions, their PAT will be rescheduled to limit the exposure to others and, the surgeon will be notified. THE PATIENT WILL NEED TO BE ASYMPTOMATIC FOR 14 DAYS.   If the patient is not experiencing any of these symptoms, the PAT nurse will instruct them to NOT bring anyone with them to their appointment since they may have these symptoms or traveled as well.   Please remind your patients and families that hospital visitation restrictions are in effect and the importance of the restrictions.     Patient verbalized understanding of instructions that were given to them at the PAT appointment. Patient was also instructed that they will need to review over the PAT instructions again at home before surgery.

## 2019-05-15 ENCOUNTER — Encounter (HOSPITAL_COMMUNITY): Payer: Self-pay

## 2019-05-15 ENCOUNTER — Encounter (HOSPITAL_COMMUNITY): Payer: Self-pay | Admitting: Vascular Surgery

## 2019-05-15 NOTE — Progress Notes (Signed)
Anesthesia Chart Review:  Case: [id number] Date/Time: 05/26/19 0715   Procedures:      Left craniotomy for tumor (Left ) - Left craniotomy for tumor     PLACEMENT OF LUMBAR DRAIN (N/A ) - PLACEMENT OF LUMBAR DRAIN     APPLICATION OF CRANIAL NAVIGATION (N/A )   Anesthesia type: General   Pre-op diagnosis: Brain tumor   Location: [facility] OR ROOM 21 / [place] OR   Surgeon: Judith Part, MD      DISCUSSION: Patient is a 43 year old female scheduled for the above procedure.  Patient with history of migraine headaches, but presented to the ER 04/22/2019 complaining of a headache slightly different than her normal migraines.  Head CT concerning for possible meningioma which was confirmed by MRI. She was referred to neurosurgery.   Other history includes never smoker, HTN, DM2 (diagnosed 2008), GERD, headaches, left thyroidectomy.  BMI is consistent with obesity.  Preoperative A1c elevated at 12.8% consistent with average glucose of 321. According to 04/06/19 endocrinology notes her A1c was 12.7% on 04/03/19, 11.0% in 2019, and 10.0% on 06/13/17. Her Tyler Aas was increased from 15 Units to 20 Units on 04/06/19. At PAT, she reported home CBGs of ~ 140-180.     I have notified Jessica at Dr. Colleen Can office of poorly controlled diabetes. She will discuss with Dr. Zada Finders and plans to reach out to patient's endocrinologist. Patient would be at increased risk for cancellation if she arrives for surgery with significant hyperglycemia.   If case remains as scheduled, the she is for preoperative COVID-19 test is scheduled for 05/21/19. Urine pregnancy test for the day of surgery.    VS: BP 139/86   Pulse 96   Temp 36.7 C   Resp 20   Ht 5\' 8"  (1.727 m)   Wt 98.5 kg   LMP 04/27/2019   SpO2 100%   BMI 33.01 kg/m   PROVIDERS: Timmie Foerster, DO is PCP ([facility] Everywhere) Bonney Aid, MD is endocrinologist [facility]; see [place]). Telemedicine visit 04/06/19.  A1c 12.7% there  04/03/19. Tresiba increased from 15 Units Q HS to 20 Units. She is also on liraglutide at 3 mg, but this is per the weight loss clinic. As of 8/2/420, she had lost 44 lbs over 4 months and had been exercising 3x/week. She reported not checking her sugars very often. She is currently not taking levothyroxine, but had normal TSH 3.49 and Free T4 0.89 on 04/03/19.     LABS: Preoperative labs noted. See DISCUSSION. (all labs ordered are listed, but only abnormal results are displayed)  Labs Reviewed  GLUCOSE, CAPILLARY - Abnormal; Notable for the following components:      Result Value   Glucose-Capillary 175 (*)    All other components within normal limits  HEMOGLOBIN A1C - Abnormal; Notable for the following components:   Hgb A1c MFr Bld 12.8 (*)    All other components within normal limits  CBC - Abnormal; Notable for the following components:   HCT 35.9 (*)    All other components within normal limits  BASIC METABOLIC PANEL - Abnormal; Notable for the following components:   Glucose, Bld 110 (*)    All other components within normal limits  TYPE AND SCREEN  ABO/RH     IMAGES: CTA head/neck 04/22/19: IMPRESSION: 1. No arterial abnormality on CTA head and neck. 2. Left skull base meningioma re-demonstrated, is partially calcified, and measures up to 23 mm diameter by CT (coronal series 8,  image 96). 3. Previous left thyroidectomy with mild-to-moderate right thyroid goiter.  MRI Brain 04/22/19: IMPRESSION: 1. 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 The mildly lobulated lesion is roughly 15 x 16 x 20 mm. It abuts the basilar artery and there is mild mass effect on the left pons but no associated brain edema. 2. Elsewhere normal MRI appearance of the brain.   EKG: 05/14/19: NSR   CV: N/A   Past Medical History:   Diagnosis Date  . Chronic headaches   . Diabetes mellitus without complication ([place])   . GERD (gastroesophageal reflux disease)   . Hypertension   . Meningioma [facility])     Past Surgical History:  Procedure Laterality Date  . THYROIDECTOMY, PARTIAL    . TUBAL LIGATION     1997  . tubal ligation reversal  2013    MEDICATIONS: . acetaminophen (TYLENOL) 325 MG tablet  . hydrochlorothiazide (MICROZIDE) 12.5 MG capsule  . hydrOXYzine (ATARAX/VISTARIL) 10 MG tablet  . Ibuprofen (ADVIL MIGRAINE) 200 MG CAPS  . Liraglutide -Weight Management (SAXENDA) 18 MG/3ML SOPN  . losartan (COZAAR) 25 MG tablet  . phentermine (ADIPEX-P) 37.5 MG tablet  . TRESIBA FLEXTOUCH 100 UNIT/ML SOPN FlexTouch Pen   No current facility-administered medications for this encounter.   Last Adipex 05/13/19. She is to hold Saxenda 3 mg on the day of surgery (prescribed at weight loss clinic). She is on [place] 20 units daily.   Myra Gianotti, PA-C Surgical Short Stay/Anesthesiology [facility] Phone [phone] [facility] Phone [phone] 05/15/2019 4:20 PM

## 2019-05-15 NOTE — Anesthesia Preprocedure Evaluation (Deleted)
Anesthesia Evaluation  Patient identified by MRN, date of birth, ID band Patient awake    Reviewed: Allergy & Precautions, NPO status , Patient's Chart, lab work & pertinent test results  Airway Mallampati: II       Dental  (+) Poor Dentition, Missing, Loose   Pulmonary neg pulmonary ROS   Pulmonary exam normal        Cardiovascular hypertension, Pt. on medications + Past MI and + Cardiac Stents  Normal cardiovascular exam     Neuro/Psych  PSYCHIATRIC DISORDERS  Depression    negative neurological ROS     GI/Hepatic   Endo/Other  diabetes, Type 2, Oral Hypoglycemic Agents    Renal/GU      Musculoskeletal   Abdominal  (+) + obese  Peds  Hematology   Anesthesia Other Findings    Ramus lesion is 80% stenosed.   Prox Cx lesion is 95% stenosed.   Mid LAD lesion is 20% stenosed.   Prox RCA lesion is 40% stenosed.   A drug-eluting stent was successfully placed using a STENT ONYX FRONTIER 3.5X15.   Post intervention, there is a 0% residual stenosis.   1.  Significant one-vessel coronary artery disease with 95% stenosis in the proximal left circumflex which is a likely culprit for non-ST elevation myocardial infarction.  In addition, there is an 80% ostial stenosis in the ramus intermedius. 2.  Left ventricular angiography was not performed.  EF was normal by echo.  Mildly elevated left ventricular end-diastolic pressure. 3.  Successful angioplasty and drug-eluting stent placement to the left circumflex. 4.  Difficult catheterization via the right radial artery due to significant tortuosity of the right subclavian and innominate arteries.  A long sheath was required to facilitate engaging the coronary arteries.   Recommendations: Dual antiplatelet therapy for at least 12 months. Aggressive treatment of risk factors. Recommend medical therapy for the stenosis in the ramus given ostial location.      Reproductive/Obstetrics                             Anesthesia Physical Anesthesia Plan  ASA: 3  Anesthesia Plan: General   Post-op Pain Management: Regional block*   Induction: Intravenous  PONV Risk Score and Plan: Ondansetron  Airway Management Planned: LMA  Additional Equipment: None  Intra-op Plan:   Post-operative Plan: Extubation in OR  Informed Consent: I have reviewed the patients History and Physical, chart, labs and discussed the procedure including the risks, benefits and alternatives for the proposed anesthesia with the patient or authorized representative who has indicated his/her understanding and acceptance.     Dental advisory given  Plan Discussed with: CRNA  Anesthesia Plan Comments: (PAT note written by Leannah Guse, PA-C.  )       Anesthesia Quick Evaluation  

## 2019-05-21 ENCOUNTER — Other Ambulatory Visit (HOSPITAL_COMMUNITY): Payer: Managed Care, Other (non HMO)

## 2019-05-26 ENCOUNTER — Inpatient Hospital Stay (HOSPITAL_COMMUNITY)
Admission: RE | Admit: 2019-05-26 | Payer: Managed Care, Other (non HMO) | Source: Ambulatory Visit | Admitting: Neurological Surgery

## 2019-05-26 ENCOUNTER — Encounter (HOSPITAL_COMMUNITY): Admission: RE | Payer: Self-pay | Source: Ambulatory Visit

## 2019-05-26 SURGERY — CRANIOTOMY TUMOR EXCISION
Anesthesia: General | Laterality: Left

## 2019-06-17 ENCOUNTER — Other Ambulatory Visit: Payer: Self-pay | Admitting: Neurological Surgery

## 2019-06-18 NOTE — Pre-Procedure Instructions (Addendum)
[id number] [address], [place] [address] Phone: [phone] Fax: [phone]      Your procedure is scheduled on Tuesday November 10th.  Report to [facility] Main Entrance "A" at 5:30 A.M., and check in at the Admitting office.  Call this number if you have problems the morning of surgery:  [phone]  Call [phone] if you have any questions prior to your surgery date Monday-Friday 8am-4pm    Remember:  Do not eat or drink after midnight the night before your surgery     Take these medicines the morning of surgery with A SIP OF WATER  acetaminophen (TYLENOL) if needed hydrOXYzine (ATARAX/VISTARIL) if needed   As of today,  STOP taking any Aspirin (unless otherwise instructed by your surgeon), Aleve, Naproxen, Ibuprofen, Motrin, Advil, Goody's, BC's, all herbal medications, fish oil, and all vitamins.   HOW TO MANAGE YOUR DIABETES BEFORE AND AFTER SURGERY  Why is it important to control my blood sugar before and after surgery? . Improving blood sugar levels before and after surgery helps healing and can limit problems. . A way of improving blood sugar control is eating a healthy diet by: o  Eating less sugar and carbohydrates o  Increasing activity/exercise o  Talking with your doctor about reaching your blood sugar goals . High blood sugars (greater than 180 mg/dL) can raise your risk of infections and slow your recovery, so you will need to focus on controlling your diabetes during the weeks before surgery. . Make sure that the doctor who takes care of your diabetes knows about your planned surgery including the date and location.  How do I manage my blood sugar before surgery? . Check your blood sugar at least 4 times a day, starting 2 days before surgery, to make sure that the level is not too high or low. o Check your blood sugar the morning of your surgery when you wake up and every 2 hours until  you get to the Short Stay unit. . If your blood sugar is less than 70 mg/dL, you will need to treat for low blood sugar: o Do not take insulin. o Treat a low blood sugar (less than 70 mg/dL) with  cup of clear juice (cranberry or apple), 4 glucose tablets, OR glucose gel. Recheck blood sugar in 15 minutes after treatment (to make sure it is greater than 70 mg/dL). If your blood sugar is not greater than 70 mg/dL on recheck, call [phone] o  for further instructions. . Report your blood sugar to the short stay nurse when you get to Short Stay.  . If you are admitted to the hospital after surgery: o Your blood sugar will be checked by the staff and you will probably be given insulin after surgery (instead of oral diabetes medicines) to make sure you have good blood sugar levels. o The goal for blood sugar control after surgery is 80-180 mg/dL.     WHAT DO I DO ABOUT MY DIABETES MEDICATION?   Marland Kitchen Do not take oral diabetes medicines (pills):  the morning of surgery.  . THE NIGHT BEFORE SURGERY, take 10 units of TRESIBA FLEXTOUCH Insulin. Do not take bedtime dose of insulin lispro (HUMALOG KWIKPEN)     . The day of surgery, do not take other diabetes injectables, including Byetta (exenatide), Bydureon (exenatide ER), Victoza (liraglutide), or Trulicity (dulaglutide).    The Morning of Surgery  Do not wear jewelry, make-up or nail  polish.  Do not wear lotions, powders, or perfumes/colognes, or deodorant  Do not shave 48 hours prior to surgery.  Men may shave face and neck.  Do not bring valuables to the hospital.  [facility] is not responsible for any belongings or valuables.  If you are a smoker, DO NOT Smoke 24 hours prior to surgery IF you wear a CPAP at night please bring your mask, tubing, and machine the morning of surgery   Remember that you must have someone to transport you home after your surgery, and remain with you for 24 hours if you are discharged the same  day.   Contacts, glasses, hearing aids, dentures or bridgework may not be worn into surgery.    Leave your suitcase in the car.  After surgery it may be brought to your room.  For patients admitted to the hospital, discharge time will be determined by your treatment team.  Patients discharged the day of surgery will not be allowed to drive home.    Special instructions:   [facility]- Preparing For Surgery  Before surgery, you can play an important role. Because skin is not sterile, your skin needs to be as free of germs as possible. You can reduce the number of germs on your skin by washing with CHG (chlorahexidine gluconate) Soap before surgery.  CHG is an antiseptic cleaner which kills germs and bonds with the skin to continue killing germs even after washing.    Oral Hygiene is also important to reduce your risk of infection.  Remember - BRUSH YOUR TEETH THE MORNING OF SURGERY WITH YOUR REGULAR TOOTHPASTE  Please do not use if you have an allergy to CHG or antibacterial soaps. If your skin becomes reddened/irritated stop using the CHG.  Do not shave (including legs and underarms) for at least 48 hours prior to first CHG shower. It is OK to shave your face.  Please follow these instructions carefully.   1. Shower the NIGHT BEFORE SURGERY and the MORNING OF SURGERY with CHG Soap.   2. If you chose to wash your hair, wash your hair first as usual with your normal shampoo.  3. After you shampoo, rinse your hair and body thoroughly to remove the shampoo.  4. Use CHG as you would any other liquid soap. You can apply CHG directly to the skin and wash gently with a scrungie or a clean washcloth.   5. Apply the CHG Soap to your body ONLY FROM THE NECK DOWN.  Do not use on open wounds or open sores. Avoid contact with your eyes, ears, mouth and genitals (private parts). Wash Face and genitals (private parts)  with your normal soap.   6. Wash thoroughly, paying special attention to the  area where your surgery will be performed.  7. Thoroughly rinse your body with warm water from the neck down.  8. DO NOT shower/wash with your normal soap after using and rinsing off the CHG Soap.  9. Pat yourself dry with a CLEAN TOWEL.  10. Wear CLEAN PAJAMAS to bed the night before surgery, wear comfortable clothes the morning of surgery  11. Place CLEAN SHEETS on your bed the night of your first shower and DO NOT SLEEP WITH PETS.    Day of Surgery:  Do not apply any deodorants/lotions. Please shower the morning of surgery with the CHG soap  Please wear clean clothes to the hospital/surgery center.   Remember to brush your teeth WITH YOUR REGULAR TOOTHPASTE.   Please read  over the following fact sheets that you were given.

## 2019-06-19 ENCOUNTER — Encounter (HOSPITAL_COMMUNITY): Payer: Self-pay

## 2019-06-19 ENCOUNTER — Other Ambulatory Visit: Payer: Self-pay

## 2019-06-19 ENCOUNTER — Encounter (HOSPITAL_COMMUNITY)
Admission: RE | Admit: 2019-06-19 | Discharge: 2019-06-19 | Disposition: A | Discharge status: Home / Self Care | Payer: Managed Care, Other (non HMO) | Source: Ambulatory Visit | Attending: Neurological Surgery | Admitting: Neurological Surgery

## 2019-06-19 ENCOUNTER — Other Ambulatory Visit (HOSPITAL_COMMUNITY)
Admission: RE | Admit: 2019-06-19 | Discharge: 2019-06-19 | Disposition: A | Discharge status: Home / Self Care | Payer: Managed Care, Other (non HMO) | Source: Ambulatory Visit | Attending: Neurological Surgery | Admitting: Neurological Surgery

## 2019-06-19 DIAGNOSIS — Z79899 Other long term (current) drug therapy: Secondary | ICD-10-CM | POA: Diagnosis not present

## 2019-06-19 DIAGNOSIS — Z01812 Encounter for preprocedural laboratory examination: Secondary | ICD-10-CM | POA: Insufficient documentation

## 2019-06-19 DIAGNOSIS — Z20828 Contact with and (suspected) exposure to other viral communicable diseases: Secondary | ICD-10-CM | POA: Diagnosis not present

## 2019-06-19 DIAGNOSIS — I10 Essential (primary) hypertension: Secondary | ICD-10-CM | POA: Diagnosis not present

## 2019-06-19 DIAGNOSIS — E049 Nontoxic goiter, unspecified: Secondary | ICD-10-CM | POA: Insufficient documentation

## 2019-06-19 DIAGNOSIS — E890 Postprocedural hypothyroidism: Secondary | ICD-10-CM | POA: Diagnosis not present

## 2019-06-19 DIAGNOSIS — E119 Type 2 diabetes mellitus without complications: Secondary | ICD-10-CM | POA: Diagnosis not present

## 2019-06-19 DIAGNOSIS — Z7901 Long term (current) use of anticoagulants: Secondary | ICD-10-CM | POA: Diagnosis not present

## 2019-06-19 DIAGNOSIS — E785 Hyperlipidemia, unspecified: Secondary | ICD-10-CM | POA: Diagnosis not present

## 2019-06-19 DIAGNOSIS — Z794 Long term (current) use of insulin: Secondary | ICD-10-CM | POA: Diagnosis not present

## 2019-06-19 DIAGNOSIS — D496 Neoplasm of unspecified behavior of brain: Secondary | ICD-10-CM | POA: Insufficient documentation

## 2019-06-19 LAB — CBC
HCT: 35.3 % — ABNORMAL LOW (ref 36.0–46.0)
Hemoglobin: 11.5 g/dL — ABNORMAL LOW (ref 12.0–15.0)
MCH: 28.3 pg (ref 26.0–34.0)
MCHC: 32.6 g/dL (ref 30.0–36.0)
MCV: 86.9 fL (ref 80.0–100.0)
Platelets: 240 10*3/uL (ref 150–400)
RBC: 4.06 MIL/uL (ref 3.87–5.11)
RDW: 12.1 % (ref 11.5–15.5)
WBC: 7.8 10*3/uL (ref 4.0–10.5)
nRBC: 0 % (ref 0.0–0.2)

## 2019-06-19 LAB — BASIC METABOLIC PANEL
Anion gap: 6 (ref 5–15)
BUN: 7 mg/dL (ref 6–20)
CO2: 25 mmol/L (ref 22–32)
Calcium: 8.6 mg/dL — ABNORMAL LOW (ref 8.9–10.3)
Chloride: 109 mmol/L (ref 98–111)
Creatinine, Ser: 0.93 mg/dL (ref 0.44–1.00)
GFR calc Af Amer: >60 mL/min (ref >60)
GFR calc non Af Amer: >60 mL/min (ref >60)
Glucose, Bld: 130 mg/dL — ABNORMAL HIGH (ref 70–99)
Potassium: 3.9 mmol/L (ref 3.5–5.1)
Sodium: 140 mmol/L (ref 135–145)

## 2019-06-19 LAB — GLUCOSE, CAPILLARY: Glucose-Capillary: 111 mg/dL — ABNORMAL HIGH (ref 70–99)

## 2019-06-19 LAB — TYPE AND SCREEN
ABO/RH(D): O POS
Antibody Screen: NEGATIVE

## 2019-06-19 NOTE — Progress Notes (Signed)
PCP - Dr. Timmie Foerster, DO Cardiologist - patient denies Endocrinologist - Dr. Garnet Koyanagi  PPM/ICD - n/a Device Orders -  Rep Notified -   Chest x-ray - n/a EKG - 05/14/2019 Stress Test - patient denies ECHO - patient denies Cardiac Cath - patient denies  Sleep Study - patient denies CPAP -   Fasting Blood Sugar - 100-140's Checks Blood Sugar - patient is on a continuous glucose monitor and will get alerted if CBG is too high or too low.  She is supposed to check it before meals, so approximately 3 times a day.  Blood Thinner Instructions: Aspirin Instructions: Patient's last dose of Phentermine was 2 weeks ago Hold Sadexa DOS  ERAS Protcol - n/a PRE-SURGERY Ensure or G2-   COVID TEST- scheduled for 06/19/19 at 10:25am   Anesthesia review: yes, A1C 12.8 on 05/14/2019  Patient denies shortness of breath, fever, cough and chest pain at PAT appointment   All instructions explained to the patient, with a verbal understanding of the material. Patient agrees to go over the instructions while at home for a better understanding. Patient also instructed to self quarantine after being tested for COVID-19. The opportunity to ask questions was provided.

## 2019-06-20 LAB — NOVEL CORONAVIRUS, NAA (HOSP ORDER, SEND-OUT TO REF LAB; TAT 18-24 HRS): SARS-CoV-2, NAA: NOT DETECTED

## 2019-06-22 NOTE — Progress Notes (Addendum)
Anesthesia Chart Review:  Case: [id number] Date/Time: 06/23/19 0715   Procedures:      Left craniotomy for tumor resection with placement of lumbar drain/brainlab/facial nerve monitoring (Left ) - Left craniotomy for tumor resection with placement of lumbar drain/brainlab/facial nerve monitoring     PLACEMENT OF LUMBAR DRAIN (N/A )     APPLICATION OF CRANIAL NAVIGATION (N/A )   Anesthesia type: General   Pre-op diagnosis: Brain tumor   Location: [facility] OR ROOM 21 / [place] [facility]   Surgeon: Judith Part, MD      DISCUSSION: Patient is a 43 year old female scheduled for the above procedure. Surgery was initially scheduled for 05/26/19; however it appears case was delayed for poorly controlled diabetes with A1c of 12.8% 05/14/19, consistent with average glucose of 321.Tyler Aas was increased from 15 Units to 20 Units on 04/06/19. She was also on liraglutide at 3 mg, but through a weight loss clinic. Since then she has seen a new endocrinologist Dr. Garnet Koyanagi, most recently 06/04/19. She had lost 50 lbs (on phentermine) in the past 3 months. She was prescribed a continuous glucose monitor with average CBG readings 143 from 05/20/19 - 06/04/19. Dr. Garnet Koyanagi felt patient was "Low Risk" to proceed with surgery from an endocrinology standpoint. She had previous medical clearance as "Moderate Risk" from 05/2019 (see below).  Last A1c 13.0% on 05/21/19 at [facility] (Dr. Garnet Koyanagi); however, as above, with medication, dietary changes, and weight loss, average CBGs as of 06/04/19 was 143. She reported fasting CBGs ~ 100-140's. A1c was not repeated at PAT given last A1c was < 60 days ago.  She will get a fasting CBG on arrival day of surgery.   She has a history of migraine headaches, but presented to the ER 04/22/2019 complaining of a headache slightly different than her normal migraines.  Head CT concerning for possible meningioma which was confirmed by MRI. She was referred to neurosurgery.   Other history  includes never smoker, HTN, DM2 (diagnosed 2008), GERD, headaches, left thyroidectomy.  BMI is consistent with obesity.   Last phentermine was ~ 2 weeks ago. 06/19/19 presurgical COVID-19 test negative.  Based on currently available information, I would anticipate that she can proceed as planned if no acute changes. She is for a urine pregnancy test on the day of surgery.    VS: BP (!) 150/93   Pulse 84   Temp 36.7 C (Oral)   Resp 20   Ht 5\' 8"  (1.727 m)   Wt 100.9 kg   SpO2 100%   BMI 33.82 kg/m    PROVIDERS: - Tamba, Ismael, DO is listed as PCP and had signed a note of medical clearance for planned surgery on 05/21/19 at "Moderate Risk" (see [place]); However, it appears that patient was established with new PCP Janie Morning, DP on 05/28/19 [facility]). Dr. Theda Sers is aware of surgery plans.  - Madelin Rear, MD is endocrinologist ([place]). Previously she saw Bonney Aid, MD with [facility] (see [place]). She is currently not taking levothyroxine, but had normal TSH 3.5 on 05/21/19 and normal Free T4 0.89 on 04/03/19.   -   LABS: Labs reviewed: Acceptable for surgery. (all labs ordered are listed, but only abnormal results are displayed)  Labs Reviewed  GLUCOSE, CAPILLARY - Abnormal; Notable for the following components:      Result Value   Glucose-Capillary 111 (*)    All other components within normal limits  BASIC METABOLIC PANEL -  Abnormal; Notable for the following components:   Glucose, Bld 130 (*)    Calcium 8.6 (*)    All other components within normal limits  CBC - Abnormal; Notable for the following components:   Hemoglobin 11.5 (*)    HCT 35.3 (*)    All other components within normal limits  TYPE AND SCREEN    IMAGES: CTA head/neck 04/22/19: IMPRESSION: 1. No arterial abnormality on CTA head and neck. 2. Left skull base meningioma re-demonstrated, is partially calcified, and measures up to  23 mm diameter by CT (coronal series 8, image 96). 3. Previous left thyroidectomy with mild-to-moderate right thyroid goiter.  MRI Brain 04/22/19: IMPRESSION: 1. 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 The mildly lobulated lesion is roughly 15 x 16 x 20 mm. It abuts the basilar artery and there is mild mass effect on the left pons but no associated brain edema. 2. Elsewhere normal MRI appearance of the brain.   EKG: 05/14/19: NSR   CV: N/A   Past Medical History:  Diagnosis Date  . Chronic headaches   . Diabetes mellitus without complication ([place])   . GERD (gastroesophageal reflux disease)   . Hypertension   . Meningioma [facility])     Past Surgical History:  Procedure Laterality Date  . THYROIDECTOMY, PARTIAL    . TUBAL LIGATION     1997  . tubal ligation reversal  2013    MEDICATIONS: . acetaminophen (TYLENOL) 325 MG tablet  . amitriptyline (ELAVIL) 10 MG tablet  . gabapentin (NEURONTIN) 100 MG capsule  . hydrOXYzine (ATARAX/VISTARIL) 50 MG tablet  . insulin lispro (HUMALOG KWIKPEN) 100 UNIT/ML KwikPen  . Liraglutide -Weight Management (SAXENDA) 18 MG/3ML SOPN  . losartan (COZAAR) 25 MG tablet  . phentermine (ADIPEX-P) 37.5 MG tablet  . TRESIBA FLEXTOUCH 100 UNIT/ML SOPN FlexTouch Pen   No current facility-administered medications for this encounter.     Myra Gianotti, PA-C Surgical Short Stay/Anesthesiology [facility] [place] Phone [phone] [facility] Phone [phone] 06/22/2019 10:19 AM

## 2019-06-22 NOTE — Anesthesia Preprocedure Evaluation (Addendum)
Anesthesia Evaluation  Patient identified by MRN, date of birth, ID band Patient awake    Reviewed: Allergy & Precautions, NPO status , Patient's Chart, lab work & pertinent test results  History of Anesthesia Complications Negative for: history of anesthetic complications  Airway Mallampati: II  TM Distance: >3 FB Neck ROM: Full    Dental  (+) Dental Advisory Given   Pulmonary neg pulmonary ROS,    breath sounds clear to auscultation       Cardiovascular hypertension, Pt. on medications  Rhythm:Regular     Neuro/Psych  Headaches, meningioma negative psych ROS   GI/Hepatic Neg liver ROS, GERD  ,  Endo/Other  diabetesMorbid obesity  Renal/GU negative Renal ROS     Musculoskeletal   Abdominal   Peds  Hematology negative hematology ROS (+)   Anesthesia Other Findings   Reproductive/Obstetrics                           Anesthesia Physical Anesthesia Plan  ASA: II  Anesthesia Plan: General   Post-op Pain Management:    Induction: Intravenous  PONV Risk Score and Plan: 3 and Ondansetron and Dexamethasone  Airway Management Planned: Oral ETT  Additional Equipment: Arterial line  Intra-op Plan:   Post-operative Plan: Extubation in OR  Informed Consent: I have reviewed the patients History and Physical, chart, labs and discussed the procedure including the risks, benefits and alternatives for the proposed anesthesia with the patient or authorized representative who has indicated his/her understanding and acceptance.     Dental advisory given  Plan Discussed with: CRNA and Surgeon  Anesthesia Plan Comments: (PAT note written 06/22/2019 by Myra Gianotti, PA-C. )       Anesthesia Quick Evaluation

## 2019-06-23 ENCOUNTER — Inpatient Hospital Stay (HOSPITAL_COMMUNITY): Payer: Managed Care, Other (non HMO) | Admitting: Anesthesiology

## 2019-06-23 ENCOUNTER — Inpatient Hospital Stay (HOSPITAL_COMMUNITY): Payer: Managed Care, Other (non HMO) | Admitting: Vascular Surgery

## 2019-06-23 ENCOUNTER — Encounter (HOSPITAL_COMMUNITY): Payer: Self-pay

## 2019-06-23 ENCOUNTER — Other Ambulatory Visit: Payer: Self-pay

## 2019-06-23 ENCOUNTER — Encounter (HOSPITAL_COMMUNITY)
Admission: RE | Disposition: A | Discharge status: Home Health | Payer: Self-pay | Source: Home / Self Care | Attending: Neurological Surgery

## 2019-06-23 ENCOUNTER — Inpatient Hospital Stay (HOSPITAL_COMMUNITY)
Admission: RE | Admit: 2019-06-23 | Discharge: 2019-06-26 | DRG: 027 | Disposition: A | Discharge status: Home Health | Payer: Managed Care, Other (non HMO) | Attending: Neurological Surgery | Admitting: Neurological Surgery

## 2019-06-23 DIAGNOSIS — D496 Neoplasm of unspecified behavior of brain: Secondary | ICD-10-CM | POA: Diagnosis present

## 2019-06-23 DIAGNOSIS — H4922 Sixth [abducent] nerve palsy, left eye: Secondary | ICD-10-CM | POA: Diagnosis present

## 2019-06-23 DIAGNOSIS — E1141 Type 2 diabetes mellitus with diabetic mononeuropathy: Secondary | ICD-10-CM | POA: Diagnosis present

## 2019-06-23 DIAGNOSIS — D320 Benign neoplasm of cerebral meninges: Principal | ICD-10-CM | POA: Diagnosis present

## 2019-06-23 DIAGNOSIS — K219 Gastro-esophageal reflux disease without esophagitis: Secondary | ICD-10-CM | POA: Diagnosis present

## 2019-06-23 DIAGNOSIS — Z6833 Body mass index (BMI) 33.0-33.9, adult: Secondary | ICD-10-CM

## 2019-06-23 DIAGNOSIS — D329 Benign neoplasm of meninges, unspecified: Secondary | ICD-10-CM

## 2019-06-23 DIAGNOSIS — Z20828 Contact with and (suspected) exposure to other viral communicable diseases: Secondary | ICD-10-CM | POA: Diagnosis present

## 2019-06-23 DIAGNOSIS — I10 Essential (primary) hypertension: Secondary | ICD-10-CM | POA: Diagnosis present

## 2019-06-23 HISTORY — PX: APPLICATION OF CRANIAL NAVIGATION: SHX6578

## 2019-06-23 HISTORY — PX: CRANIOTOMY: SHX93

## 2019-06-23 LAB — CBC
HCT: 31.2 % — ABNORMAL LOW (ref 36.0–46.0)
Hemoglobin: 10.3 g/dL — ABNORMAL LOW (ref 12.0–15.0)
MCH: 28.4 pg (ref 26.0–34.0)
MCHC: 33 g/dL (ref 30.0–36.0)
MCV: 86 fL (ref 80.0–100.0)
Platelets: 233 10*3/uL (ref 150–400)
RBC: 3.63 MIL/uL — ABNORMAL LOW (ref 3.87–5.11)
RDW: 12.5 % (ref 11.5–15.5)
WBC: 15.4 10*3/uL — ABNORMAL HIGH (ref 4.0–10.5)
nRBC: 0 % (ref 0.0–0.2)

## 2019-06-23 LAB — GLUCOSE, CAPILLARY
Glucose-Capillary: 120 mg/dL — ABNORMAL HIGH (ref 70–99)
Glucose-Capillary: 138 mg/dL — ABNORMAL HIGH (ref 70–99)
Glucose-Capillary: 169 mg/dL — ABNORMAL HIGH (ref 70–99)
Glucose-Capillary: 169 mg/dL — ABNORMAL HIGH (ref 70–99)
Glucose-Capillary: 209 mg/dL — ABNORMAL HIGH (ref 70–99)
Glucose-Capillary: 150 mg/dL — ABNORMAL HIGH (ref 70–99)

## 2019-06-23 LAB — CREATININE, SERUM
Creatinine, Ser: 0.83 mg/dL (ref 0.44–1.00)
GFR calc Af Amer: >60 mL/min (ref >60)
GFR calc non Af Amer: >60 mL/min (ref >60)

## 2019-06-23 LAB — POCT PREGNANCY, URINE: Preg Test, Ur: NEGATIVE

## 2019-06-23 LAB — MRSA PCR SCREENING: MRSA by PCR: NEGATIVE

## 2019-06-23 SURGERY — CRANIOTOMY TUMOR EXCISION
Anesthesia: General | Site: Head

## 2019-06-23 MED ORDER — ONDANSETRON HCL 4 MG/2ML IJ SOLN
INTRAMUSCULAR | Status: DC | PRN
  Administered 2019-06-23: 4 mg via INTRAVENOUS

## 2019-06-23 MED ORDER — ALBUMIN HUMAN 5 % IV SOLN
INTRAVENOUS | Status: DC | PRN
  Administered 2019-06-23 (×2): via INTRAVENOUS

## 2019-06-23 MED ORDER — LIDOCAINE-EPINEPHRINE 1 %-1:100000 IJ SOLN
INTRAMUSCULAR | Status: AC
  Filled 2019-06-23: qty 1

## 2019-06-23 MED ORDER — HEPARIN SODIUM (PORCINE) 5000 UNIT/ML IJ SOLN
5000.0000 [IU] | Freq: Three times a day (TID) | INTRAMUSCULAR | Status: DC
  Administered 2019-06-25 – 2019-06-26 (×5): 5000 [IU] via SUBCUTANEOUS
  Filled 2019-06-23 (×5): qty 1

## 2019-06-23 MED ORDER — INSULIN LISPRO (1 UNIT DIAL) 100 UNIT/ML (KWIKPEN): 4.0000 [IU] | PEN_INJECTOR | Freq: Three times a day (TID) | SUBCUTANEOUS | Status: DC | PRN

## 2019-06-23 MED ORDER — OXYCODONE HCL 5 MG/5ML PO SOLN: 5.0000 mg | Freq: Once | ORAL | Status: DC | PRN

## 2019-06-23 MED ORDER — CHLORHEXIDINE GLUCONATE CLOTH 2 % EX PADS: 6.0000 | MEDICATED_PAD | Freq: Once | CUTANEOUS | Status: DC

## 2019-06-23 MED ORDER — THROMBIN 20000 UNITS EX SOLR
CUTANEOUS | Status: DC | PRN
  Administered 2019-06-23: 20 mL via TOPICAL

## 2019-06-23 MED ORDER — BACITRACIN ZINC 500 UNIT/GM EX OINT
TOPICAL_OINTMENT | CUTANEOUS | Status: DC | PRN
  Administered 2019-06-23: 1 via TOPICAL

## 2019-06-23 MED ORDER — SODIUM CHLORIDE 0.9 % IV SOLN
INTRAVENOUS | Status: DC | PRN
  Administered 2019-06-23: 07:00:00

## 2019-06-23 MED ORDER — ACETAMINOPHEN 10 MG/ML IV SOLN
INTRAVENOUS | Status: AC
  Filled 2019-06-23: qty 100

## 2019-06-23 MED ORDER — PHENTERMINE HCL 37.5 MG PO TABS: 37.5000 mg | ORAL_TABLET | Freq: Every day | ORAL | Status: DC

## 2019-06-23 MED ORDER — HYDROMORPHONE HCL 1 MG/ML IJ SOLN
0.5000 mg | INTRAMUSCULAR | Status: DC | PRN
  Administered 2019-06-23 – 2019-06-24 (×6): 0.5 mg via INTRAVENOUS
  Filled 2019-06-23 (×6): qty 1

## 2019-06-23 MED ORDER — HYDROXYZINE HCL 50 MG PO TABS
50.0000 mg | ORAL_TABLET | Freq: Three times a day (TID) | ORAL | Status: DC | PRN
  Administered 2019-06-23 – 2019-06-24 (×2): 50 mg via ORAL
  Filled 2019-06-23 (×4): qty 1

## 2019-06-23 MED ORDER — HEMOSTATIC AGENTS (NO CHARGE) OPTIME
TOPICAL | Status: DC | PRN
  Administered 2019-06-23: 1 via TOPICAL

## 2019-06-23 MED ORDER — SODIUM CHLORIDE 0.9 % IV SOLN
INTRAVENOUS | Status: DC | PRN
  Administered 2019-06-23 (×2): via INTRAVENOUS

## 2019-06-23 MED ORDER — ONDANSETRON HCL 4 MG/2ML IJ SOLN
4.0000 mg | INTRAMUSCULAR | Status: DC | PRN
  Administered 2019-06-24: 4 mg via INTRAVENOUS
  Filled 2019-06-23: qty 2

## 2019-06-23 MED ORDER — HYDRALAZINE HCL 20 MG/ML IJ SOLN
10.0000 mg | Freq: Once | INTRAMUSCULAR | Status: AC
  Administered 2019-06-23: 10 mg via INTRAVENOUS

## 2019-06-23 MED ORDER — FENTANYL CITRATE (PF) 250 MCG/5ML IJ SOLN
INTRAMUSCULAR | Status: AC
  Filled 2019-06-23: qty 5

## 2019-06-23 MED ORDER — THROMBIN 5000 UNITS EX SOLR
CUTANEOUS | Status: AC
  Filled 2019-06-23: qty 5000

## 2019-06-23 MED ORDER — THROMBIN 20000 UNITS EX SOLR
CUTANEOUS | Status: AC
  Filled 2019-06-23: qty 20000

## 2019-06-23 MED ORDER — MIDAZOLAM HCL 2 MG/2ML IJ SOLN
INTRAMUSCULAR | Status: AC
  Filled 2019-06-23: qty 2

## 2019-06-23 MED ORDER — LIDOCAINE 2% (20 MG/ML) 5 ML SYRINGE
INTRAMUSCULAR | Status: DC | PRN
  Administered 2019-06-23: 60 mg via INTRAVENOUS

## 2019-06-23 MED ORDER — 0.9 % SODIUM CHLORIDE (POUR BTL) OPTIME
TOPICAL | Status: DC | PRN
  Administered 2019-06-23 (×3): 1000 mL

## 2019-06-23 MED ORDER — PROPOFOL 10 MG/ML IV BOLUS
INTRAVENOUS | Status: AC
  Filled 2019-06-23: qty 20

## 2019-06-23 MED ORDER — SODIUM CHLORIDE 0.9 % IV SOLN
INTRAVENOUS | Status: DC | PRN
  Administered 2019-06-23: 08:00:00 via INTRAVENOUS

## 2019-06-23 MED ORDER — ACETAMINOPHEN 325 MG PO TABS
650.0000 mg | ORAL_TABLET | ORAL | Status: DC | PRN
  Administered 2019-06-25: 650 mg via ORAL
  Filled 2019-06-23: qty 2

## 2019-06-23 MED ORDER — HYDRALAZINE HCL 20 MG/ML IJ SOLN
INTRAMUSCULAR | Status: AC
  Filled 2019-06-23: qty 1

## 2019-06-23 MED ORDER — MIDAZOLAM HCL 2 MG/2ML IJ SOLN
INTRAMUSCULAR | Status: DC | PRN
  Administered 2019-06-23: 1 mg via INTRAVENOUS

## 2019-06-23 MED ORDER — LOSARTAN POTASSIUM 50 MG PO TABS
25.0000 mg | ORAL_TABLET | Freq: Every day | ORAL | Status: DC
  Administered 2019-06-23 – 2019-06-26 (×4): 25 mg via ORAL
  Filled 2019-06-23 (×4): qty 1

## 2019-06-23 MED ORDER — FAMOTIDINE IN NACL 20-0.9 MG/50ML-% IV SOLN
20.0000 mg | Freq: Two times a day (BID) | INTRAVENOUS | Status: DC
  Administered 2019-06-23: 20 mg via INTRAVENOUS
  Filled 2019-06-23 (×2): qty 50

## 2019-06-23 MED ORDER — ACETAMINOPHEN 10 MG/ML IV SOLN
INTRAVENOUS | Status: DC | PRN
  Administered 2019-06-23: 1000 mg via INTRAVENOUS

## 2019-06-23 MED ORDER — ESMOLOL HCL 100 MG/10ML IV SOLN
INTRAVENOUS | Status: AC
  Filled 2019-06-23: qty 10

## 2019-06-23 MED ORDER — ESMOLOL HCL 100 MG/10ML IV SOLN
INTRAVENOUS | Status: DC | PRN
  Administered 2019-06-23: 50 mg via INTRAVENOUS

## 2019-06-23 MED ORDER — LABETALOL HCL 5 MG/ML IV SOLN
10.0000 mg | INTRAVENOUS | Status: DC | PRN
  Administered 2019-06-23 – 2019-06-24 (×2): 20 mg via INTRAVENOUS
  Filled 2019-06-23 (×2): qty 4

## 2019-06-23 MED ORDER — DEXAMETHASONE SODIUM PHOSPHATE 10 MG/ML IJ SOLN
INTRAMUSCULAR | Status: DC | PRN
  Administered 2019-06-23: 5 mg via INTRAVENOUS

## 2019-06-23 MED ORDER — DOCUSATE SODIUM 100 MG PO CAPS
100.0000 mg | ORAL_CAPSULE | Freq: Two times a day (BID) | ORAL | Status: DC
  Administered 2019-06-23 – 2019-06-26 (×4): 100 mg via ORAL
  Filled 2019-06-23 (×6): qty 1

## 2019-06-23 MED ORDER — INSULIN GLARGINE 100 UNIT/ML ~~LOC~~ SOLN
20.0000 [IU] | Freq: Every day | SUBCUTANEOUS | Status: DC
  Administered 2019-06-24 – 2019-06-25 (×2): 20 [IU] via SUBCUTANEOUS
  Filled 2019-06-23 (×3): qty 0.2

## 2019-06-23 MED ORDER — PROMETHAZINE HCL 25 MG/ML IJ SOLN
6.2500 mg | Freq: Once | INTRAMUSCULAR | Status: AC
  Administered 2019-06-23: 6.25 mg via INTRAVENOUS

## 2019-06-23 MED ORDER — SUCCINYLCHOLINE CHLORIDE 20 MG/ML IJ SOLN
INTRAMUSCULAR | Status: DC | PRN
  Administered 2019-06-23: 120 mg via INTRAVENOUS

## 2019-06-23 MED ORDER — THROMBIN 5000 UNITS EX SOLR
OROMUCOSAL | Status: DC | PRN
  Administered 2019-06-23: 5 mL via TOPICAL

## 2019-06-23 MED ORDER — AMITRIPTYLINE HCL 10 MG PO TABS
10.0000 mg | ORAL_TABLET | Freq: Every day | ORAL | Status: DC
  Administered 2019-06-23 – 2019-06-25 (×3): 10 mg via ORAL
  Filled 2019-06-23 (×4): qty 1

## 2019-06-23 MED ORDER — ONDANSETRON HCL 4 MG PO TABS
4.0000 mg | ORAL_TABLET | ORAL | Status: DC | PRN
  Administered 2019-06-25: 4 mg via ORAL
  Filled 2019-06-23: qty 1

## 2019-06-23 MED ORDER — ACETAMINOPHEN 160 MG/5ML PO SOLN: 1000.0000 mg | Freq: Once | ORAL | Status: DC | PRN

## 2019-06-23 MED ORDER — CHLORHEXIDINE GLUCONATE CLOTH 2 % EX PADS
6.0000 | MEDICATED_PAD | Freq: Every day | CUTANEOUS | Status: DC
  Administered 2019-06-23 – 2019-06-26 (×4): 6 via TOPICAL

## 2019-06-23 MED ORDER — LABETALOL HCL 5 MG/ML IV SOLN
INTRAVENOUS | Status: AC
  Filled 2019-06-23: qty 4

## 2019-06-23 MED ORDER — OXYCODONE HCL 5 MG PO TABS: 5.0000 mg | ORAL_TABLET | Freq: Once | ORAL | Status: DC | PRN

## 2019-06-23 MED ORDER — BACITRACIN ZINC 500 UNIT/GM EX OINT
TOPICAL_OINTMENT | CUTANEOUS | Status: AC
  Filled 2019-06-23: qty 28.35

## 2019-06-23 MED ORDER — PROMETHAZINE HCL 25 MG PO TABS
12.5000 mg | ORAL_TABLET | ORAL | Status: DC | PRN
  Administered 2019-06-25: 25 mg via ORAL
  Filled 2019-06-23: qty 1

## 2019-06-23 MED ORDER — PROPOFOL 10 MG/ML IV BOLUS
INTRAVENOUS | Status: DC | PRN
  Administered 2019-06-23 (×3): 20 mg via INTRAVENOUS
  Administered 2019-06-23: 40 mg via INTRAVENOUS

## 2019-06-23 MED ORDER — ACETAMINOPHEN 500 MG PO TABS: 1000.0000 mg | ORAL_TABLET | Freq: Once | ORAL | Status: DC | PRN

## 2019-06-23 MED ORDER — SODIUM CHLORIDE 0.9 % IV SOLN
INTRAVENOUS | Status: DC
  Administered 2019-06-23: 16:00:00 via INTRAVENOUS

## 2019-06-23 MED ORDER — CEFAZOLIN SODIUM 1 G IJ SOLR
INTRAMUSCULAR | Status: AC
  Filled 2019-06-23: qty 20

## 2019-06-23 MED ORDER — PROMETHAZINE HCL 25 MG/ML IJ SOLN
INTRAMUSCULAR | Status: AC
  Filled 2019-06-23: qty 1

## 2019-06-23 MED ORDER — POLYETHYLENE GLYCOL 3350 17 G PO PACK: 17.0000 g | PACK | Freq: Every day | ORAL | Status: DC | PRN

## 2019-06-23 MED ORDER — SODIUM CHLORIDE 0.9 % IV SOLN
0.0125 ug/kg/min | INTRAVENOUS | Status: DC
  Filled 2019-06-23: qty 2000

## 2019-06-23 MED ORDER — INSULIN ASPART 100 UNIT/ML ~~LOC~~ SOLN
0.0000 [IU] | Freq: Three times a day (TID) | SUBCUTANEOUS | Status: DC
  Administered 2019-06-23: 19:00:00 5 [IU] via SUBCUTANEOUS
  Administered 2019-06-24: 3 [IU] via SUBCUTANEOUS
  Administered 2019-06-24: 2 [IU] via SUBCUTANEOUS
  Administered 2019-06-24 – 2019-06-25 (×2): 5 [IU] via SUBCUTANEOUS
  Administered 2019-06-25 – 2019-06-26 (×3): 3 [IU] via SUBCUTANEOUS
  Administered 2019-06-26: 5 [IU] via SUBCUTANEOUS

## 2019-06-23 MED ORDER — PHENYLEPHRINE HCL-NACL 10-0.9 MG/250ML-% IV SOLN
INTRAVENOUS | Status: DC | PRN
  Administered 2019-06-23: 20 ug/min via INTRAVENOUS

## 2019-06-23 MED ORDER — LABETALOL HCL 5 MG/ML IV SOLN
INTRAVENOUS | Status: DC | PRN
  Administered 2019-06-23: 10 mg via INTRAVENOUS
  Administered 2019-06-23: 5 mg via INTRAVENOUS

## 2019-06-23 MED ORDER — LIDOCAINE-EPINEPHRINE 1 %-1:100000 IJ SOLN
INTRAMUSCULAR | Status: DC | PRN
  Administered 2019-06-23: 7 mL

## 2019-06-23 MED ORDER — ACETAMINOPHEN 10 MG/ML IV SOLN: 1000.0000 mg | Freq: Once | INTRAVENOUS | Status: DC | PRN

## 2019-06-23 MED ORDER — FENTANYL CITRATE (PF) 100 MCG/2ML IJ SOLN
INTRAMUSCULAR | Status: AC
  Filled 2019-06-23: qty 2

## 2019-06-23 MED ORDER — LIRAGLUTIDE -WEIGHT MANAGEMENT 18 MG/3ML ~~LOC~~ SOPN: 3.0000 mg | PEN_INJECTOR | Freq: Every day | SUBCUTANEOUS | Status: DC

## 2019-06-23 MED ORDER — CEFAZOLIN SODIUM-DEXTROSE 2-4 GM/100ML-% IV SOLN
2.0000 g | INTRAVENOUS | Status: AC
  Administered 2019-06-23 (×2): 2 g via INTRAVENOUS
  Filled 2019-06-23: qty 100

## 2019-06-23 MED ORDER — SODIUM CHLORIDE 0.9 % IV SOLN
0.0500 ug/kg/min | INTRAVENOUS | Status: AC
  Administered 2019-06-23: 12:00:00 via INTRAVENOUS
  Administered 2019-06-23: .2 ug/kg/min via INTRAVENOUS
  Filled 2019-06-23: qty 4000

## 2019-06-23 MED ORDER — GABAPENTIN 100 MG PO CAPS
100.0000 mg | ORAL_CAPSULE | Freq: Every day | ORAL | Status: DC
  Administered 2019-06-23 – 2019-06-25 (×3): 100 mg via ORAL
  Filled 2019-06-23 (×3): qty 1

## 2019-06-23 MED ORDER — ACETAMINOPHEN 650 MG RE SUPP: 650.0000 mg | RECTAL | Status: DC | PRN

## 2019-06-23 MED ORDER — CEFAZOLIN SODIUM-DEXTROSE 2-4 GM/100ML-% IV SOLN
2.0000 g | Freq: Three times a day (TID) | INTRAVENOUS | Status: AC
  Administered 2019-06-23 – 2019-06-24 (×2): 2 g via INTRAVENOUS
  Filled 2019-06-23 (×2): qty 100

## 2019-06-23 MED ORDER — FENTANYL CITRATE (PF) 250 MCG/5ML IJ SOLN
INTRAMUSCULAR | Status: DC | PRN
  Administered 2019-06-23: 50 ug via INTRAVENOUS

## 2019-06-23 MED ORDER — HYDROCODONE-ACETAMINOPHEN 5-325 MG PO TABS
1.0000 | ORAL_TABLET | ORAL | Status: DC | PRN
  Administered 2019-06-23 – 2019-06-26 (×11): 1 via ORAL
  Filled 2019-06-23 (×11): qty 1

## 2019-06-23 MED ORDER — FENTANYL CITRATE (PF) 100 MCG/2ML IJ SOLN
25.0000 ug | INTRAMUSCULAR | Status: DC | PRN
  Administered 2019-06-23: 50 ug via INTRAVENOUS

## 2019-06-23 SURGICAL SUPPLY — 104 items
ADH SKN CLS APL DERMABOND .7 (GAUZE/BANDAGES/DRESSINGS) ×3
APL SKNCLS STERI-STRIP NONHPOA (GAUZE/BANDAGES/DRESSINGS)
BENZOIN TINCTURE PRP APPL 2/3 (GAUZE/BANDAGES/DRESSINGS) IMPLANT
BLADE CLIPPER SURG (BLADE) ×4 IMPLANT
BLADE SAW GIGLI 16 STRL (MISCELLANEOUS) IMPLANT
BLADE SURG 15 STRL LF DISP TIS (BLADE) IMPLANT
BLADE SURG 15 STRL SS (BLADE)
BNDG CMPR 75X41 PLY HI ABS (GAUZE/BANDAGES/DRESSINGS)
BNDG GAUZE ELAST 4 BULKY (GAUZE/BANDAGES/DRESSINGS) IMPLANT
BNDG STRETCH 4X75 STRL LF (GAUZE/BANDAGES/DRESSINGS) IMPLANT
BUR ACORN 9.0 PRECISION (BURR) ×4 IMPLANT
BUR ROUND FLUTED 4 SOFT TCH (BURR) ×2 IMPLANT
BUR SABER DIAMOND 2.5 (BURR) ×4 IMPLANT
BUR SPIRAL ROUTER 2.3 (BUR) ×4 IMPLANT
CANISTER SUCT 3000ML PPV (MISCELLANEOUS) ×8 IMPLANT
CATH VENTRIC 35X38 W/TROCAR LG (CATHETERS) IMPLANT
CLIP VESOCCLUDE MED 6/CT (CLIP) IMPLANT
CONT SPEC 4OZ CLIKSEAL STRL BL (MISCELLANEOUS) ×4 IMPLANT
COVER MAYO STAND STRL (DRAPES) IMPLANT
COVER WAND RF STERILE (DRAPES) ×4 IMPLANT
DECANTER SPIKE VIAL GLASS SM (MISCELLANEOUS) ×4 IMPLANT
DERMABOND ADVANCED (GAUZE/BANDAGES/DRESSINGS) ×1
DERMABOND ADVANCED .7 DNX12 (GAUZE/BANDAGES/DRESSINGS) ×2 IMPLANT
DRAIN SUBARACHNOID (WOUND CARE) IMPLANT
DRAPE HALF SHEET 40X57 (DRAPES) ×4 IMPLANT
DRAPE MICROSCOPE LEICA (MISCELLANEOUS) ×2 IMPLANT
DRAPE NEUROLOGICAL W/INCISE (DRAPES) ×4 IMPLANT
DRAPE STERI IOBAN 125X83 (DRAPES) IMPLANT
DRAPE SURG 17X23 STRL (DRAPES) IMPLANT
DRAPE WARM FLUID 44X44 (DRAPES) ×4 IMPLANT
DRSG ADAPTIC 3X8 NADH LF (GAUZE/BANDAGES/DRESSINGS) IMPLANT
DRSG TELFA 3X8 NADH (GAUZE/BANDAGES/DRESSINGS) IMPLANT
DURAPREP 6ML APPLICATOR 50/CS (WOUND CARE) ×4 IMPLANT
ELECT REM PT RETURN 9FT ADLT (ELECTROSURGICAL) ×4
ELECTRODE REM PT RTRN 9FT ADLT (ELECTROSURGICAL) ×3 IMPLANT
EVACUATOR 1/8 PVC DRAIN (DRAIN) IMPLANT
EVACUATOR SILICONE 100CC (DRAIN) IMPLANT
FEE INTRAOP MONITOR IMPULS NCS (MISCELLANEOUS) ×2 IMPLANT
FORCEPS BIPOLAR SPETZLER 8 1.0 (NEUROSURGERY SUPPLIES) ×4 IMPLANT
GAUZE 4X4 16PLY RFD (DISPOSABLE) IMPLANT
GAUZE SPONGE 4X4 12PLY STRL (GAUZE/BANDAGES/DRESSINGS) IMPLANT
GLOVE BIO SURGEON STRL SZ7 (GLOVE) ×4 IMPLANT
GLOVE BIO SURGEON STRL SZ7.5 (GLOVE) ×16 IMPLANT
GLOVE BIOGEL PI IND STRL 6.5 (GLOVE) ×1 IMPLANT
GLOVE BIOGEL PI IND STRL 7.0 (GLOVE) ×1 IMPLANT
GLOVE BIOGEL PI IND STRL 7.5 (GLOVE) ×4 IMPLANT
GLOVE BIOGEL PI INDICATOR 6.5 (GLOVE) ×1
GLOVE BIOGEL PI INDICATOR 7.0 (GLOVE) ×1
GLOVE BIOGEL PI INDICATOR 7.5 (GLOVE) ×2
GLOVE ECLIPSE 7.0 STRL STRAW (GLOVE) ×8 IMPLANT
GLOVE EXAM NITRILE LRG STRL (GLOVE) IMPLANT
GLOVE EXAM NITRILE XL STR (GLOVE) IMPLANT
GLOVE EXAM NITRILE XS STR PU (GLOVE) IMPLANT
GLOVE SURG SS PI 6.0 STRL IVOR (GLOVE) ×2 IMPLANT
GLOVE SURG SS PI 8.0 STRL IVOR (GLOVE) ×3 IMPLANT
GOWN STRL REUS W/ TWL LRG LVL3 (GOWN DISPOSABLE) ×12 IMPLANT
GOWN STRL REUS W/ TWL XL LVL3 (GOWN DISPOSABLE) IMPLANT
GOWN STRL REUS W/TWL 2XL LVL3 (GOWN DISPOSABLE) IMPLANT
GOWN STRL REUS W/TWL LRG LVL3 (GOWN DISPOSABLE) ×20
GOWN STRL REUS W/TWL XL LVL3 (GOWN DISPOSABLE)
GRAFT DURAGEN MATRIX 1WX1L (Tissue) ×2 IMPLANT
HEMOSTAT POWDER KIT SURGIFOAM (HEMOSTASIS) ×4 IMPLANT
HEMOSTAT SURGICEL 2X14 (HEMOSTASIS) ×1 IMPLANT
HOOK DURA 1/2IN (MISCELLANEOUS) ×4 IMPLANT
INTRAOP MONITOR FEE IMPULS NCS (MISCELLANEOUS) ×3
INTRAOP MONITOR FEE IMPULSE (MISCELLANEOUS) ×1
IV NS 1000ML (IV SOLUTION)
IV NS 1000ML BAXH (IV SOLUTION) ×2 IMPLANT
KIT BASIN OR (CUSTOM PROCEDURE TRAY) ×4 IMPLANT
KIT DRAIN CSF ACCUDRAIN (MISCELLANEOUS) IMPLANT
KIT TURNOVER KIT B (KITS) ×4 IMPLANT
MARKER SPHERE PSV REFLC 13MM (MARKER) ×8 IMPLANT
NDL SPNL 18GX3.5 QUINCKE PK (NEEDLE) IMPLANT
NEEDLE HYPO 22GX1.5 SAFETY (NEEDLE) ×4 IMPLANT
NEEDLE SPNL 18GX3.5 QUINCKE PK (NEEDLE) IMPLANT
NS IRRIG 1000ML POUR BTL (IV SOLUTION) ×12 IMPLANT
PACK CRANIOTOMY CUSTOM (CUSTOM PROCEDURE TRAY) ×4 IMPLANT
PAD DRESSING TELFA 3X8 NADH (GAUZE/BANDAGES/DRESSINGS) IMPLANT
PATTIES SURGICAL .25X.25 (GAUZE/BANDAGES/DRESSINGS) IMPLANT
PATTIES SURGICAL .5 X.5 (GAUZE/BANDAGES/DRESSINGS) ×2 IMPLANT
PATTIES SURGICAL .5 X3 (DISPOSABLE) IMPLANT
PATTIES SURGICAL 1/4 X 3 (GAUZE/BANDAGES/DRESSINGS) IMPLANT
PATTIES SURGICAL 1X1 (DISPOSABLE) IMPLANT
PIN MAYFIELD SKULL DISP (PIN) ×4 IMPLANT
PLATE 1.5/0.2 85X50M HEX PNL (Plate) ×3 IMPLANT
RUBBERBAND STERILE (MISCELLANEOUS) ×4 IMPLANT
SCREW SELF DRILL HT 1.5/4MM (Screw) ×12 IMPLANT
SEALANT ADHERUS EXTEND TIP (MISCELLANEOUS) ×2 IMPLANT
SPECIMEN JAR SMALL (MISCELLANEOUS) IMPLANT
SPONGE NEURO XRAY DETECT 1X3 (DISPOSABLE) IMPLANT
SPONGE SURGIFOAM ABS GEL 100 (HEMOSTASIS) ×4 IMPLANT
STAPLER VISISTAT 35W (STAPLE) ×7 IMPLANT
SUT ETHILON 3 0 FSL (SUTURE) IMPLANT
SUT ETHILON 3 0 PS 1 (SUTURE) IMPLANT
SUT MNCRL AB 3-0 PS2 18 (SUTURE) ×2 IMPLANT
SUT NURALON 4 0 TR CR/8 (SUTURE) ×12 IMPLANT
SUT SILK 0 TIES 10X30 (SUTURE) IMPLANT
SUT VIC AB 2-0 CP2 18 (SUTURE) ×6 IMPLANT
TOWEL GREEN STERILE (TOWEL DISPOSABLE) ×4 IMPLANT
TOWEL GREEN STERILE FF (TOWEL DISPOSABLE) ×4 IMPLANT
TRAY FOLEY MTR SLVR 16FR STAT (SET/KITS/TRAYS/PACK) ×4 IMPLANT
TUBE CONNECTING 12X1/4 (SUCTIONS) ×4 IMPLANT
UNDERPAD 30X30 (UNDERPADS AND DIAPERS) ×1 IMPLANT
WATER STERILE IRR 1000ML POUR (IV SOLUTION) ×4 IMPLANT

## 2019-06-23 NOTE — H&P (Signed)
Surgical H&P Update  HPI: 43 y.o. woman with history of worsening headaches, which prompted further workup and discovered a skull base tumor, likely meningioma, here for surgical resection. No changes in health since she was last seen. Still having headaches and wishes to proceed with surgery.  PMHx:  Past Medical History:  Diagnosis Date  . Chronic headaches   . Diabetes mellitus without complication ([place])   . GERD (gastroesophageal reflux disease)   . Hypertension   . Meningioma ([place])    FamHx: History reviewed. No pertinent family history. SocHx:  reports that she has never smoked. She has never used smokeless tobacco. She reports previous alcohol use. She reports that she does not use drugs.  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 Risks, benefits, and alternatives discussed and the patient would like to continue with surgery.  -OR today -4N post-op  Judith Part, MD 06/23/19 6:46 AM

## 2019-06-23 NOTE — Brief Op Note (Signed)
06/23/2019  2:33 PM  PATIENT:  Tricia Soto  43 y.o. female  PRE-OPERATIVE DIAGNOSIS:  Brain tumor  POST-OPERATIVE DIAGNOSIS:  Brain tumor  PROCEDURE:  Procedure(s): Left craniotomy for tumor resection with brainlab/facial nerve monitoring (Left) APPLICATION OF CRANIAL NAVIGATION (N/A)  SURGEON:  Surgeon(s) and Role:    * Jaysa Kise, Joyice Faster, MD - Primary    * Consuella Lose, MD - Assisting  PHYSICIAN ASSISTANT:   ANESTHESIA:   general  EBL:  400 mL   BLOOD ADMINISTERED:none  DRAINS: none   LOCAL MEDICATIONS USED:  LIDOCAINE   SPECIMEN:  Source of Specimen:  Left posterior fossa mass  DISPOSITION OF SPECIMEN:  PATHOLOGY  COUNTS:  YES  TOURNIQUET:  * No tourniquets in log *  DICTATION: .Note written in EPIC  PLAN OF CARE: Admit to inpatient   PATIENT DISPOSITION:  PACU - hemodynamically stable.   Delay start of Pharmacological VTE agent (>24hrs) due to surgical blood loss or risk of bleeding: yes

## 2019-06-23 NOTE — Anesthesia Procedure Notes (Addendum)
Procedure Name: Intubation Date/Time: 06/23/2019 7:45 AM Performed by: Larene Beach, CRNA Pre-anesthesia Checklist: Patient identified, Emergency Drugs available, Suction available and Patient being monitored Patient Re-evaluated:Patient Re-evaluated prior to induction Oxygen Delivery Method: Circle system utilized Preoxygenation: Pre-oxygenation with 100% oxygen Induction Type: IV induction Ventilation: Mask ventilation without difficulty Laryngoscope Size: Mac and 3 Grade View: Grade I Tube type: Oral Tube size: 7.0 mm Number of attempts: 1 Airway Equipment and Method: Stylet Placement Confirmation: ETT inserted through vocal cords under direct vision,  positive ETCO2 and breath sounds checked- equal and bilateral Secured at: 22 cm Tube secured with: Tape Dental Injury: Teeth and Oropharynx as per pre-operative assessment  Comments: Intubated by Orma Flaming, SRNA

## 2019-06-23 NOTE — Anesthesia Procedure Notes (Signed)
Arterial Line Insertion Start/End11/05/2019 6:50 AM, 06/23/2019 7:00 AM Performed by: Oleta Mouse, MD, Lilymarie Scroggins, Wyatt Haste, CRNA, CRNA  Patient location: Pre-op. Preanesthetic checklist: patient identified, IV checked, site marked, risks and benefits discussed, surgical consent, monitors and equipment checked, pre-op evaluation, timeout performed and anesthesia consent Lidocaine 1% used for infiltration Left, radial was placed Catheter size: 20 Fr Hand hygiene performed  and maximum sterile barriers used   Attempts: 1 Procedure performed without using ultrasound guided technique. Following insertion, dressing applied and Biopatch. Post procedure assessment: normal and unchanged  Patient tolerated the procedure well with no immediate complications. Additional procedure comments: A-line done by Orma Flaming, SRNA.

## 2019-06-23 NOTE — Transfer of Care (Signed)
Immediate Anesthesia Transfer of Care Note  Patient: Tricia Soto  Procedure(s) Performed: Left craniotomy for tumor resection with brainlab/facial nerve monitoring (Left Head) APPLICATION OF CRANIAL NAVIGATION (N/A Head)  Patient Location: PACU  Anesthesia Type:General  Level of Consciousness: drowsy and patient cooperative  Airway & Oxygen Therapy: Patient Spontanous Breathing and Patient connected to nasal cannula oxygen  Post-op Assessment: Report given to RN, Post -op Vital signs reviewed and stable and Patient moving all extremities X 4  Post vital signs: Reviewed and stable  Last Vitals:  Vitals Value Taken Time  BP 172/101 06/23/19 1426  Temp    Pulse 85 06/23/19 1428  Resp 8 06/23/19 1428  SpO2 100 % 06/23/19 1428  Vitals shown include unvalidated device data.  Last Pain:  Vitals:   06/23/19 0608  TempSrc: Oral  PainSc: 0-No pain         Complications: No apparent anesthesia complications

## 2019-06-23 NOTE — Progress Notes (Signed)
Neurosurgery Service Post-operative progress note  Assessment & Plan: 43 y.o. 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 Seen in PACU, FCx4, still recovering from anesthesia, +L CN6 palsy, expected given manipulation during surgery, otherwise doing well.  -admit to 4N -MRI brain tomorrow to assess for extent of resection  Judith Part  06/23/19 2:54 PM

## 2019-06-23 NOTE — Op Note (Signed)
PATIENT: Tricia Soto  DAY OF SURGERY: 06/23/19   PRE-OPERATIVE DIAGNOSIS:  Petroclival meningioma   POST-OPERATIVE DIAGNOSIS:  Petroclival meningioma   PROCEDURE:  Left retrosigmoid craniotomy for resection of petroclival meningioma, use of frameless stereotaxy, use of facial nerve monitoring   SURGEON:  Surgeon(s) and Role:    Judith Part, MD - Primary    Consuella Lose, MD - Assisting   ANESTHESIA: ETGA   BRIEF HISTORY: This is a 43 year old woman who presented with worsening left-sided headaches. The patient was found to have an enhancing mass consistent with meningioma in the left petroclival region superior to the trigeminal nerve. This was discussed with the patient as well as risks, benefits, and alternative treatment options. Given her headaches, she opted to proceed with surgical resection.   OPERATIVE DETAIL: The patient was taken to the operating room and placed on the OR table in the supine position with the head turned to the right, exposing the left mastoid region. A formal time out was performed with two patient identifiers and confirmed the operative site. Anesthesia was induced by the anesthesia team. The [place] head holder was applied to the head and a registration array was attached to the [place]. This was co-registered with the patient's preoperative imaging, the fit appeared to be acceptable. Using frameless stereotaxy, the operative trajectory was planned and the incision was marked. Hair was clipped with surgical clippers over the incision and the area was then prepped and draped in a sterile fashion.  A linear incision was placed two finger breadths behind the left ear in the retrosigmoid region. Soft tissue dissection was performed, and a cerebellar retractor was used to keep the soft tissues retracted. A standard retrosigmoid craniectomy was performed with a high speed drill with exposure of a small portion of the transverse and sigmoid sinuses to  maximize the operative corridor. The dura was opened along the sinuses and retracted inferiorly with sutures.   The microscope was draped and brought into the field. Dr. Kathyrn Sheriff scrubbed in for this portion of the case to help with the initial tumor dissection before debulking, due to limited room around the tumor. The trigeminal cistern was entered and CSF was drained with visualization of the petrous face then identification of the 7-8 complex, trigeminal nerve, and superior petrosal vein complex. The superior petrosal vein was being tethered after relaxation of CSF so it was coagulated and transected. The tumor was located superior to the trigeminal nerve and its borders were located and dissected. The 4th nerve was identified and was not involved and was therefore not manipulated. Given the relative displacement of each nerve, it was presumed that this was a typical petroclival meningioma.  There was a very prominent trigeminal notch on the petrous face. I therefore used a large piece of bone wax to protect structures and avoid using a patty that could be caught in the drill. The notch was then drilled flush with a 2.63mm diamond drill with irrigation to prevent overheating of the 7-8 complex. The inferior aspect of the tumor was then accessible and, after some dissection, the 6th nerve was located.  The tumor was then coagulated and debulked internally. The remainder and capsule were coagulated off of the dural surfaces then dissected off of the 6th & 5th nerves then brainstem. Finally, it was dissected off of the basilar, which was followed up to the PCA and dissected free as far as possible. It was not possible to visualize the middle fossa lateral to  the tentorial edge.  Following tumor resection, hemostasis was confirmed and the dura was closed with sutures. The mastoid air cells were waxed during opening and then waxed again during closing. A piece of duragen was placed followed by Adheris dural  sealant. A piece of titanium mesh was attached using titanium screws.   All instrument and sponge counts were correct, the incision was then closed in layers. The patient was then returned to anesthesia for emergence. No apparent complications at the completion of the procedure.   EBL:  49mL   DRAINS: none   SPECIMENS: Left posterior fossa tumor   Judith Part, MD 06/23/19 7:29 AM

## 2019-06-24 ENCOUNTER — Encounter (HOSPITAL_COMMUNITY): Payer: Self-pay | Admitting: Neurological Surgery

## 2019-06-24 ENCOUNTER — Inpatient Hospital Stay (HOSPITAL_COMMUNITY): Payer: Managed Care, Other (non HMO)

## 2019-06-24 LAB — POCT I-STAT 7, (LYTES, BLD GAS, ICA,H+H)
Acid-base deficit: 2 mmol/L (ref 0.0–2.0)
Acid-base deficit: 4 mmol/L — ABNORMAL HIGH (ref 0.0–2.0)
Bicarbonate: 21 mmol/L (ref 20.0–28.0)
Bicarbonate: 20.9 mmol/L (ref 20.0–28.0)
Calcium, Ion: 1.14 mmol/L — ABNORMAL LOW (ref 1.15–1.40)
Calcium, Ion: 1.15 mmol/L (ref 1.15–1.40)
HCT: 29 % — ABNORMAL LOW (ref 36.0–46.0)
HCT: 29 % — ABNORMAL LOW (ref 36.0–46.0)
Hemoglobin: 9.9 g/dL — ABNORMAL LOW (ref 12.0–15.0)
Hemoglobin: 9.9 g/dL — ABNORMAL LOW (ref 12.0–15.0)
O2 Saturation: 100 %
O2 Saturation: 100 %
Potassium: 3.8 mmol/L (ref 3.5–5.1)
Potassium: 3.9 mmol/L (ref 3.5–5.1)
Sodium: 141 mmol/L (ref 135–145)
Sodium: 141 mmol/L (ref 135–145)
TCO2: 22 mmol/L (ref 22–32)
TCO2: 22 mmol/L (ref 22–32)
pCO2 arterial: 29.7 mmHg — ABNORMAL LOW (ref 32.0–48.0)
pCO2 arterial: 35.9 mmHg (ref 32.0–48.0)
pH, Arterial: 7.458 — ABNORMAL HIGH (ref 7.350–7.450)
pH, Arterial: 7.372 (ref 7.350–7.450)
pO2, Arterial: 242 mmHg — ABNORMAL HIGH (ref 83.0–108.0)
pO2, Arterial: 252 mmHg — ABNORMAL HIGH (ref 83.0–108.0)

## 2019-06-24 LAB — GLUCOSE, CAPILLARY
Glucose-Capillary: 158 mg/dL — ABNORMAL HIGH (ref 70–99)
Glucose-Capillary: 193 mg/dL — ABNORMAL HIGH (ref 70–99)
Glucose-Capillary: 227 mg/dL — ABNORMAL HIGH (ref 70–99)
Glucose-Capillary: 187 mg/dL — ABNORMAL HIGH (ref 70–99)

## 2019-06-24 MED ORDER — FAMOTIDINE 20 MG PO TABS
20.0000 mg | ORAL_TABLET | Freq: Two times a day (BID) | ORAL | Status: DC
  Administered 2019-06-24 – 2019-06-26 (×5): 20 mg via ORAL
  Filled 2019-06-24 (×5): qty 1

## 2019-06-24 MED ORDER — GADOBUTROL 1 MMOL/ML IV SOLN
10.0000 mL | Freq: Once | INTRAVENOUS | Status: AC | PRN
  Administered 2019-06-24: 10 mL via INTRAVENOUS

## 2019-06-24 NOTE — Evaluation (Signed)
Physical Therapy Evaluation Patient Details Name: Tricia Soto MRN: [id number] DOB: June 28, 1976 Today's Date: 06/24/2019   History of Present Illness  43 yo female s/p L retrosigmoid craniotomy for resection of petroclival meningioma near trigeminal nerve on 11/10. 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 PMH includes chronic headaches, DM, GERd, HTN.  Clinical Impression   Pt presents with impaired standing balance, increased time and effort to mobilize, visual tracking deficit due to CN VI involvement with L peripheral vision deficits, dizziness with mobility, and decreased activity tolerance. Pt to benefit from acute PT to address deficits. Pt ambulated short hallway distance with RW and min assist for correcting drifting towards L, steadying. Pt limited by dizziness/diaphoresis (BP WFL), difficulty with L peripheral vision, and fatigue. PT recommending HHPT with 24/7 assist from her daughter, who was approved to work from home for 2 weeks. PT to progress mobility as tolerated, and will continue to follow acutely.      Follow Up Recommendations Home health PT;Supervision/Assistance - 24 hour    Equipment Recommendations  Other (comment)(to be determined, may progress to no AD needs)    Recommendations for Other Services       Precautions / Restrictions Precautions Precautions: Fall(moderate) Restrictions Weight Bearing Restrictions: No      Mobility  Bed Mobility Overal bed mobility: Needs Assistance Bed Mobility: Supine to Sit     Supine to sit: Min assist;HOB elevated     General bed mobility comments: light min assist for LE management, increased time to scoot to EOB. Pt reporting dizziness upon sitting EOB, BP 128/77, and dizziness resolved with sustained sitting.  Transfers Overall transfer level: Needs assistance Equipment used: Rolling walker (2 wheeled) Transfers: Sit to/from Stand Sit to Stand: Min guard;From elevated surface          General transfer comment: Min guard for safety, verbal cuing for hand placement when rising. Pt also needed some physical assist for hand placement on handles of RW.  Ambulation/Gait Ambulation/Gait assistance: Min assist;+2 safety/equipment Gait Distance (Feet): 30 Feet Assistive device: Rolling walker (2 wheeled) Gait Pattern/deviations: Step-through pattern;Decreased stride length;Staggering left;Trunk flexed Gait velocity: decr   General Gait Details: Min assist for RW management as pt listing to L due to L peripheral vision deficits, steadying. Verbal cuing for upright posture as pt with L lateral leaning, placement in RW. Pt limited in ambulation distance due to dizziness, diaphoresis, nausea. BP141/88 with HR 98-113 bpm.  Stairs            Wheelchair Mobility    Modified Rankin (Stroke Patients Only)       Balance Overall balance assessment: Needs assistance Sitting-balance support: No upper extremity supported;Feet supported Sitting balance-Leahy Scale: Good Sitting balance - Comments: able to sit EOB without PT assist, able to lean L and R without LOB or unsteadiness     Standing balance-Leahy Scale: Fair Standing balance comment: able to stand without UE support, requires UE support for dynamic standing                             Pertinent Vitals/Pain Pain Assessment: Faces Faces Pain Scale: Hurts little more Pain Location: head Pain Descriptors / Indicators: Headache Pain Intervention(s): Limited activity within patient's tolerance;Monitored during session;Repositioned;RN gave pain meds during session    [place] expects to be discharged to:: Private residence Living Arrangements: Spouse/significant other Available Help at Discharge: Family;Available 24 hours/day(daughter is a Pharmacist, hospital who  will work from home 2 weeks for 24/7 assist for pt) Type of Home: Apartment Home Access: Stairs to enter Entrance Stairs-Rails:  Right Entrance Stairs-Number of Steps: flight Home Layout: One level Home Equipment: None      Prior Function Level of Independence: Independent               Hand Dominance   Dominant Hand: Right    Extremity/Trunk Assessment   Upper Extremity Assessment Upper Extremity Assessment: Defer to OT evaluation    Lower Extremity Assessment Lower Extremity Assessment: Overall WFL for tasks assessed    Cervical / Trunk Assessment Cervical / Trunk Assessment: Normal  Communication   Communication: No difficulties  Cognition Arousal/Alertness: Awake/alert Behavior During Therapy: WFL for tasks assessed/performed Overall Cognitive Status: Within Functional Limits for tasks assessed Area of Impairment: Safety/judgement                         Safety/Judgement: Decreased awareness of safety;Decreased awareness of deficits     General Comments: pt pleasant and talkative with PT, spatial difficulties on L side due to CN VI involvement      General Comments General comments (skin integrity, edema, etc.): R nystagmus with visual tracking, pt with intermittent complaints of double vision since surgery    Exercises     Assessment/Plan    PT Assessment Patient needs continued PT services  PT Problem List Decreased mobility;Decreased safety awareness;Decreased activity tolerance;Decreased balance;Decreased knowledge of use of DME;Pain       PT Treatment Interventions DME instruction;Therapeutic activities;Gait training;Therapeutic exercise;Patient/family education;Balance training;Stair training;Functional mobility training    PT Goals (Current goals can be found in the Care Plan section)  Acute Rehab PT Goals Patient Stated Goal: go home with help of family PT Goal Formulation: With patient Time For Goal Achievement: 07/08/19 Potential to Achieve Goals: Good    Frequency Min 3X/week   Barriers to discharge        Co-evaluation                AM-PAC PT "6 Clicks" Mobility  Outcome Measure Help needed turning from your back to your side while in a flat bed without using bedrails?: A Little Help needed moving from lying on your back to sitting on the side of a flat bed without using bedrails?: A Little Help needed moving to and from a bed to a chair (including a wheelchair)?: A Little Help needed standing up from a chair using your arms (e.g., wheelchair or bedside chair)?: A Little Help needed to walk in hospital room?: A Little Help needed climbing 3-5 steps with a railing? : A Lot 6 Click Score: 17    End of Session Equipment Utilized During Treatment: Gait belt Activity Tolerance: Patient limited by fatigue;Other (comment)(nausea, dizziness) Patient left: in chair;with call bell/phone within reach(pt verbally agrees to press call button and wait for assist prior to mobilizing back to bed) Nurse Communication: Mobility status PT Visit Diagnosis: Difficulty in walking, not elsewhere classified (R26.2);Unsteadiness on feet (R26.81);Dizziness and giddiness (R42)    Time: [id number] PT Time Calculation (min) (ACUTE ONLY): 23 min   Charges:   PT Evaluation $PT Eval Low Complexity: 1 Low PT Treatments $Gait Training: 8-22 mins       Yolonda Purtle E, PT Acute Rehabilitation Services Pager [phone]  Office [phone]  Ezzie Senat D Kayliana Codd 06/24/2019, 1:00 PM

## 2019-06-24 NOTE — Progress Notes (Signed)
Neurosurgery Service Progress Note  Subjective: No acute events overnight, denies diplopia, +facial numbness on L   Objective: Vitals:   06/24/19 0400 06/24/19 0500 06/24/19 0600 06/24/19 0700  BP: (!) 152/88 136/84 (!) 146/90 136/87  Pulse: (!) 105 (!) 106 (!) 104 (!) 105  Resp: 16 (!) 8 16 12   Temp: 98.6 F (37 C)     TempSrc: Oral     SpO2: 99% 97% 97% 99%  Weight:      Height:       Temp (24hrs), Avg:98.2 F (36.8 C), Min:97.9 F (36.6 C), Max:98.6 F (37 C)  CBC Latest Ref Rng & Units 06/23/2019 06/19/2019 05/14/2019  WBC 4.0 - 10.5 K/uL 15.4(H) 7.8 7.9  Hemoglobin 12.0 - 15.0 g/dL 10.3(L) 11.5(L) 12.1  Hematocrit 36.0 - 46.0 % 31.2(L) 35.3(L) 35.9(L)  Platelets 150 - 400 K/uL 233 240 224   BMP Latest Ref Rng & Units 06/23/2019 06/19/2019 05/14/2019  Glucose 70 - 99 mg/dL - 130(H) 110(H)  BUN 6 - 20 mg/dL - 7 9  Creatinine 0.44 - 1.00 mg/dL 0.83 0.93 0.83  Sodium 135 - 145 mmol/L - 140 140  Potassium 3.5 - 5.1 mmol/L - 3.9 3.6  Chloride 98 - 111 mmol/L - 109 106  CO2 22 - 32 mmol/L - 25 28  Calcium 8.9 - 10.3 mg/dL - 8.6(L) 9.2    Intake/Output Summary (Last 24 hours) at 06/24/2019 [id number] Last data filed at 06/24/2019 0700 Gross per 24 hour  Intake 3318.98 ml  Output 3395 ml  Net -76.02 ml    Current Facility-Administered Medications:  .  0.9 %  sodium chloride infusion, , Intravenous, Continuous, Ostergard, Joyice Faster, MD, Last Rate: 10 mL/hr at 06/24/19 0700 .  acetaminophen (TYLENOL) tablet 650 mg, 650 mg, Oral, Q4H PRN **OR** acetaminophen (TYLENOL) suppository 650 mg, 650 mg, Rectal, Q4H PRN, Ostergard, Thomas A, MD .  amitriptyline (ELAVIL) tablet 10 mg, 10 mg, Oral, QHS, Ostergard, Joyice Faster, MD, 10 mg at 06/23/19 2305 .  Chlorhexidine Gluconate Cloth 2 % PADS 6 each, 6 each, Topical, Daily, Judith Part, MD, 6 each at 06/23/19 1853 .  docusate sodium (COLACE) capsule 100 mg, 100 mg, Oral, BID, Judith Part, MD, 100 mg at 06/23/19 2305 .   famotidine (PEPCID) IVPB 20 mg premix, 20 mg, Intravenous, Q12H, Judith Part, MD, Stopped at 06/23/19 1641 .  gabapentin (NEURONTIN) capsule 100 mg, 100 mg, Oral, QHS, Ostergard, Joyice Faster, MD, 100 mg at 06/23/19 2306 .  [START ON 06/25/2019] heparin injection 5,000 Units, 5,000 Units, Subcutaneous, Q8H, Ostergard, Thomas A, MD .  HYDROcodone-acetaminophen (NORCO/VICODIN) 5-325 MG per tablet 1 tablet, 1 tablet, Oral, Q4H PRN, Judith Part, MD, 1 tablet at 06/24/19 0813 .  HYDROmorphone (DILAUDID) injection 0.5 mg, 0.5 mg, Intravenous, Q3H PRN, Judith Part, MD, 0.5 mg at 06/24/19 0413 .  hydrOXYzine (ATARAX/VISTARIL) tablet 50 mg, 50 mg, Oral, TID PRN, Judith Part, MD, 50 mg at 06/23/19 2306 .  insulin aspart (novoLOG) injection 0-15 Units, 0-15 Units, Subcutaneous, TID WC, Judith Part, MD, 2 Units at 06/24/19 0813 .  insulin glargine (LANTUS) injection 20 Units, 20 Units, Subcutaneous, QHS, Ostergard, Thomas A, MD .  labetalol (NORMODYNE) injection 10-40 mg, 10-40 mg, Intravenous, Q10 min PRN, Judith Part, MD, 20 mg at 06/24/19 0137 .  losartan (COZAAR) tablet 25 mg, 25 mg, Oral, Daily, Judith Part, MD, 25 mg at 06/23/19 1608 .  ondansetron (ZOFRAN) tablet 4 mg, 4 mg, Oral,  Q4H PRN **OR** ondansetron (ZOFRAN) injection 4 mg, 4 mg, Intravenous, Q4H PRN, Ostergard, Thomas A, MD .  polyethylene glycol (MIRALAX / GLYCOLAX) packet 17 g, 17 g, Oral, Daily PRN, Judith Part, MD .  promethazine (PHENERGAN) tablet 12.5-25 mg, 12.5-25 mg, Oral, Q4H PRN, Judith Part, MD .  remifentanil (ULTIVA) 2 mg in 100 mL normal saline (20 mcg/mL) Optime, 0.0125 mcg/kg/min, Intravenous, To OR, Ostergard, Joyice Faster, MD   Physical Exam: AOx3, PERRL, EOMI except for L 6th, FS, +L V1-V3 numbness, Strength 5/5 x4, SILTx4, no drift  Assessment & Plan: 43 y.o. woman s/p resection of petroclival meningioma. 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  -MRI w/wo today to assess extent of resection -transfer to stepdown -d/c foley & aline -PT/OT -SCDs/TEDs, start [place]  06/24/19 8:22 AM

## 2019-06-25 ENCOUNTER — Other Ambulatory Visit: Payer: Self-pay | Admitting: Radiation Therapy

## 2019-06-25 LAB — GLUCOSE, CAPILLARY
Glucose-Capillary: 213 mg/dL — ABNORMAL HIGH (ref 70–99)
Glucose-Capillary: 160 mg/dL — ABNORMAL HIGH (ref 70–99)
Glucose-Capillary: 178 mg/dL — ABNORMAL HIGH (ref 70–99)
Glucose-Capillary: 259 mg/dL — ABNORMAL HIGH (ref 70–99)

## 2019-06-25 LAB — SURGICAL PATHOLOGY

## 2019-06-25 NOTE — Progress Notes (Signed)
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 Repeat MRI showed: Small amount of residual enhancing tumor at the anterolateral 2 o'clock position relative to the brainstem; Small area of acute ischemia in the left pons, near the resection site; Faint leptomeningeal contrast enhancement along the left pons and left cerebellum, new from the prior study. This may be due to blood-brain barrier breakdown in the postoperative setting. PMH includes chronic headaches, DM, GERd, HTN.    PT Comments    Patient requires incr time due to continued severe vertigo, nausea, and pain. Attempted multiple compensation techniques to assist with mobility, with pt doing best with eyes closed during rest and closing one eye when mobilizing to reduce diplopia. Remains very unsteady and requires use of RW. Patient lives in second floor apartment and only tolerated max of 20 ft of walking with PT. Will attempt to see 11/13 a.m. as ?discharge home planned, however do not currently foresee that pt will be able to tolerate ride home and climbing 12 steps to apartment.     Follow Up Recommendations  Home health PT;Supervision/Assistance - 24 hour     Equipment Recommendations  Rolling walker with 5" wheels    Recommendations for Other Services       Precautions / Restrictions Precautions Precautions: Fall    Mobility  Bed Mobility Overal bed mobility: Needs Assistance Bed Mobility: Sidelying to Sit   Sidelying to sit: Supervision          Transfers Overall transfer level: Needs assistance Equipment used: Rolling walker (2 wheeled) Transfers: Sit to/from Stand Sit to Stand: Min guard;From elevated surface         General  transfer comment: Min guard for safety, verbal cuing for hand placement when rising. x2 from bed and toilte  Ambulation/Gait Ambulation/Gait assistance: Min assist Gait Distance (Feet): 20 Feet(12) Assistive device: Rolling walker (2 wheeled) Gait Pattern/deviations: Step-through pattern;Decreased stride length;Staggering left;Shuffle     General Gait Details: poor foot clearance as pt trying to maintain balance; maneuvered RW without assist, but "parked" it off to the side as approaching toilet and then sink despite cues to stay close to [employer] Mobility    Modified Rankin (Stroke Patients Only)       Balance Overall balance assessment: Needs assistance Sitting-balance support: No upper extremity supported;Feet supported Sitting balance-Leahy Scale: Good     Standing balance support: No upper extremity supported;During functional activity Standing balance-Leahy Scale: Fair Standing balance comment: able to stand without UE support, requires UE support for dynamic standing                            Cognition Arousal/Alertness: Awake/alert Behavior During Therapy: Flat affect;WFL for tasks assessed/performed Overall Cognitive Status: Within Functional Limits for tasks assessed                                        Exercises      General Comments General comments (skin integrity, edema, etc.): Educated pt on focusing eyes on a single target  to help minimize vertigo/nausea/imbalance. Attempted use of glasses (with lense occlusion provided by OT) and then patch over left eye with pt unable to tolerate the pressure/sensation of items on her head. Did best with closing one eye to decr diplopia. Educated on sitting up in chair or at least keeping [name] elevated to assist with decreasing edema at incision site (pt was sidelying with HOB flat on arrival). She does not want to try ice to area      Pertinent Vitals/Pain  Pain Assessment: 0-10 Pain Score: 5  Pain Location: head Pain Descriptors / Indicators: Headache;Operative site guarding Pain Intervention(s): Limited activity within patient's tolerance;Monitored during session;Repositioned;Patient requesting pain meds-RN notified;RN gave pain meds during session;Relaxation    Home Living                      Prior Function            PT Goals (current goals can now be found in the care plan section) Acute Rehab PT Goals Patient Stated Goal: go home with help of family Time For Goal Achievement: 07/08/19 Potential to Achieve Goals: Good Progress towards PT goals: Progressing toward goals    Frequency    Min 3X/week      PT Plan Current plan remains appropriate    Co-evaluation              AM-PAC PT "6 Clicks" Mobility   Outcome Measure  Help needed turning from your back to your side while in a flat bed without using bedrails?: A Little Help needed moving from lying on your back to sitting on the side of a flat bed without using bedrails?: A Little Help needed moving to and from a bed to a chair (including a wheelchair)?: A Little Help needed standing up from a chair using your arms (e.g., wheelchair or bedside chair)?: A Little Help needed to walk in hospital room?: A Little Help needed climbing 3-5 steps with a railing? : A Lot 6 Click Score: 17    End of Session Equipment Utilized During Treatment: Gait belt Activity Tolerance: Treatment limited secondary to medical complications (Comment) Patient left: in chair;with call bell/phone within reach;with family/visitor present(pt/family educated NOT to try to get up without staff) Nurse Communication: Mobility status;Other (comment)(requesting pain medicine; ?nausea medicine) PT Visit Diagnosis: Difficulty in walking, not elsewhere classified (R26.2);Unsteadiness on feet (R26.81);Dizziness and giddiness (R42)     Time: [id number] PT Time Calculation (min) (ACUTE  ONLY): 44 min  Charges:  $Gait Training: 8-22 mins $Neuromuscular Re-education: 8-22 mins $Self Care/Home Management: 8-22                      Barry Brunner, PT Pager [phone]    Rexanne Mano 06/25/2019, 5:45 PM

## 2019-06-25 NOTE — Progress Notes (Signed)
Neurosurgery Service Progress Note  Subjective: No acute events overnight, denies diplopia, +facial numbness on L   Objective: Vitals:   06/25/19 0000 06/25/19 0100 06/25/19 0207 06/25/19 0336  BP: (!) 145/89 127/82 (!) 143/86   Pulse: 100 97    Resp:      Temp: 98.9 F (37.2 C)  98.6 F (37 C) 98.4 F (36.9 C)  TempSrc: Oral  Oral Oral  SpO2: 96% 96%    Weight:      Height:       Temp (24hrs), Avg:98.7 F (37.1 C), Min:98.4 F (36.9 C), Max:98.9 F (37.2 C)  CBC Latest Ref Rng & Units 06/23/2019 06/23/2019 06/23/2019  WBC 4.0 - 10.5 K/uL 15.4(H) - -  Hemoglobin 12.0 - 15.0 g/dL 10.3(L) 9.9(L) 9.9(L)  Hematocrit 36.0 - 46.0 % 31.2(L) 29.0(L) 29.0(L)  Platelets 150 - 400 K/uL 233 - -   BMP Latest Ref Rng & Units 06/23/2019 06/23/2019 06/23/2019  Glucose 70 - 99 mg/dL - - -  BUN 6 - 20 mg/dL - - -  Creatinine 0.44 - 1.00 mg/dL 0.83 - -  Sodium 135 - 145 mmol/L - 141 141  Potassium 3.5 - 5.1 mmol/L - 3.9 3.8  Chloride 98 - 111 mmol/L - - -  CO2 22 - 32 mmol/L - - -  Calcium 8.9 - 10.3 mg/dL - - -    Intake/Output Summary (Last 24 hours) at 06/25/2019 0651 Last data filed at 06/25/2019 0100 Gross per 24 hour  Intake 40.65 ml  Output 150 ml  Net -109.35 ml    Current Facility-Administered Medications:  .  0.9 %  sodium chloride infusion, , Intravenous, Continuous, Asucena Galer, Joyice Faster, MD, Stopped at 06/24/19 1004 .  acetaminophen (TYLENOL) tablet 650 mg, 650 mg, Oral, Q4H PRN **OR** acetaminophen (TYLENOL) suppository 650 mg, 650 mg, Rectal, Q4H PRN, Judith Part, MD .  amitriptyline (ELAVIL) tablet 10 mg, 10 mg, Oral, QHS, Gaige Sebo, Joyice Faster, MD, 10 mg at 06/24/19 2129 .  Chlorhexidine Gluconate Cloth 2 % PADS 6 each, 6 each, Topical, Daily, Judith Part, MD, 6 each at 06/24/19 [phone] .  docusate sodium (COLACE) capsule 100 mg, 100 mg, Oral, BID, Judith Part, MD, 100 mg at 06/23/19 2305 .  famotidine (PEPCID) tablet 20 mg, 20 mg, Oral, BID,  Judith Part, MD, 20 mg at 06/24/19 2130 .  gabapentin (NEURONTIN) capsule 100 mg, 100 mg, Oral, QHS, Mckynna Vanloan A, MD, 100 mg at 06/24/19 2129 .  heparin injection 5,000 Units, 5,000 Units, Subcutaneous, Q8H, Judith Part, MD, 5,000 Units at 06/25/19 0558 .  HYDROcodone-acetaminophen (NORCO/VICODIN) 5-325 MG per tablet 1 tablet, 1 tablet, Oral, Q4H PRN, Judith Part, MD, 1 tablet at 06/25/19 0219 .  HYDROmorphone (DILAUDID) injection 0.5 mg, 0.5 mg, Intravenous, Q3H PRN, Judith Part, MD, 0.5 mg at 06/24/19 2000 .  hydrOXYzine (ATARAX/VISTARIL) tablet 50 mg, 50 mg, Oral, TID PRN, Judith Part, MD, 50 mg at 06/24/19 2134 .  insulin aspart (novoLOG) injection 0-15 Units, 0-15 Units, Subcutaneous, TID WC, Wilmer Santillo, Joyice Faster, MD, 5 Units at 06/24/19 1739 .  insulin glargine (LANTUS) injection 20 Units, 20 Units, Subcutaneous, QHS, Judith Part, MD, 20 Units at 06/24/19 2130 .  labetalol (NORMODYNE) injection 10-40 mg, 10-40 mg, Intravenous, Q10 min PRN, Judith Part, MD, 20 mg at 06/24/19 0137 .  losartan (COZAAR) tablet 25 mg, 25 mg, Oral, Daily, Judith Part, MD, 25 mg at 06/24/19 0958 .  ondansetron (ZOFRAN) tablet  4 mg, 4 mg, Oral, Q4H PRN **OR** ondansetron (ZOFRAN) injection 4 mg, 4 mg, Intravenous, Q4H PRN, Judith Part, MD, 4 mg at 06/24/19 1027 .  polyethylene glycol (MIRALAX / GLYCOLAX) packet 17 g, 17 g, Oral, Daily PRN, Bear Osten A, MD .  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 Expected post-op CN5 / CN6 palsies due to nerve manipulation, nerves both in continuinity, should recover well. 11/11 MRI w/ diffusion changes along lateral pons, residual tumor measuring ~0.3cc  attached to PCA.  -add on to tumor board for [facility] for residual tumor -PT/OT rec home health PT -still having some dizziness with standing, likely related to vestibulocochlear nerve irritation, possible discharge tomorrow -SCDs/TEDs, start [facility] today  Judith Part  06/25/19 6:51 AM

## 2019-06-26 LAB — GLUCOSE, CAPILLARY
Glucose-Capillary: 210 mg/dL — ABNORMAL HIGH (ref 70–99)
Glucose-Capillary: 182 mg/dL — ABNORMAL HIGH (ref 70–99)
Glucose-Capillary: 188 mg/dL — ABNORMAL HIGH (ref 70–99)

## 2019-06-26 MED ORDER — ONDANSETRON HCL 4 MG PO TABS: 4.0000 mg | ORAL_TABLET | Freq: Three times a day (TID) | ORAL | 1 refills | Status: AC | PRN

## 2019-06-26 MED ORDER — MECLIZINE HCL 25 MG PO TABS
25.0000 mg | ORAL_TABLET | Freq: Three times a day (TID) | ORAL | Status: DC | PRN
  Administered 2019-06-26 (×2): 25 mg via ORAL
  Filled 2019-06-26 (×2): qty 1

## 2019-06-26 MED ORDER — MECLIZINE HCL 25 MG PO TABS: 25.0000 mg | ORAL_TABLET | Freq: Three times a day (TID) | ORAL | 0 refills | Status: DC | PRN

## 2019-06-26 MED ORDER — HYDROCODONE-ACETAMINOPHEN 5-325 MG PO TABS: 1.0000 | ORAL_TABLET | ORAL | 0 refills | Status: DC | PRN

## 2019-06-26 NOTE — Progress Notes (Signed)
Pt and husband given D/C education and all questions answered. No printed prescriptions to give and equipment was delivered to the room prior to D/C. Pt taken to car with all belongings.

## 2019-06-26 NOTE — Anesthesia Postprocedure Evaluation (Signed)
Anesthesia Post Note  Patient: Tricia Soto  Procedure(s) Performed: Left craniotomy for tumor resection with brainlab/facial nerve monitoring (Left Head) APPLICATION OF CRANIAL NAVIGATION (N/A Head)     Patient location during evaluation: PACU Anesthesia Type: General Level of consciousness: patient cooperative and awake Pain management: pain level controlled Vital Signs Assessment: post-procedure vital signs reviewed and stable Respiratory status: spontaneous breathing, nonlabored ventilation, respiratory function stable and patient connected to nasal cannula oxygen Cardiovascular status: blood pressure returned to baseline and stable Postop Assessment: no apparent nausea or vomiting Anesthetic complications: no    Last Vitals:  Vitals:   06/26/19 0500 06/26/19 0814  BP: 139/78 (!) 153/96  Pulse: 93 (!) 106  Resp:  17  Temp: 36.9 C 36.9 C  SpO2:  99%    Last Pain:  Vitals:   06/26/19 1007  TempSrc:   PainSc: 4                  Amahia Madonia

## 2019-06-26 NOTE — Progress Notes (Signed)
Neurosurgery Service Progress Note  Subjective: No acute events overnight, having some diplopia and vertigo  Objective: Vitals:   06/25/19 1645 06/25/19 2141 06/26/19 0023 06/26/19 0500  BP: (!) 154/80 (!) 142/86 (!) 152/89 139/78  Pulse: 94 96 93 93  Resp: 19 17 10    Temp: 98.8 F (37.1 C) 98.5 F (36.9 C) 97.8 F (36.6 C) 98.4 F (36.9 C)  TempSrc: Oral Oral Oral Oral  SpO2: 98% 97%    Weight:      Height:       Temp (24hrs), Avg:98.5 F (36.9 C), Min:97.8 F (36.6 C), Max:99.1 F (37.3 C)  CBC Latest Ref Rng & Units 06/23/2019 06/23/2019 06/23/2019  WBC 4.0 - 10.5 K/uL 15.4(H) - -  Hemoglobin 12.0 - 15.0 g/dL 10.3(L) 9.9(L) 9.9(L)  Hematocrit 36.0 - 46.0 % 31.2(L) 29.0(L) 29.0(L)  Platelets 150 - 400 K/uL 233 - -   BMP Latest Ref Rng & Units 06/23/2019 06/23/2019 06/23/2019  Glucose 70 - 99 mg/dL - - -  BUN 6 - 20 mg/dL - - -  Creatinine 0.44 - 1.00 mg/dL 0.83 - -  Sodium 135 - 145 mmol/L - 141 141  Potassium 3.5 - 5.1 mmol/L - 3.9 3.8  Chloride 98 - 111 mmol/L - - -  CO2 22 - 32 mmol/L - - -  Calcium 8.9 - 10.3 mg/dL - - -   No intake or output data in the 24 hours ending 06/26/19 0806  Current Facility-Administered Medications:  .  0.9 %  sodium chloride infusion, , Intravenous, Continuous, Rohil Lesch, Joyice Faster, MD, Stopped at 06/24/19 1004 .  acetaminophen (TYLENOL) tablet 650 mg, 650 mg, Oral, Q4H PRN, 650 mg at 06/25/19 1614 **OR** acetaminophen (TYLENOL) suppository 650 mg, 650 mg, Rectal, Q4H PRN, Judith Part, MD .  amitriptyline (ELAVIL) tablet 10 mg, 10 mg, Oral, QHS, Kvon Mcilhenny, Joyice Faster, MD, 10 mg at 06/25/19 2132 .  Chlorhexidine Gluconate Cloth 2 % PADS 6 each, 6 each, Topical, Daily, Judith Part, MD, 6 each at 06/25/19 [phone] .  docusate sodium (COLACE) capsule 100 mg, 100 mg, Oral, BID, Judith Part, MD, 100 mg at 06/25/19 2132 .  famotidine (PEPCID) tablet 20 mg, 20 mg, Oral, BID, Judith Part, MD, 20 mg at 06/25/19  2132 .  gabapentin (NEURONTIN) capsule 100 mg, 100 mg, Oral, QHS, Hameed Kolar A, MD, 100 mg at 06/25/19 2132 .  heparin injection 5,000 Units, 5,000 Units, Subcutaneous, Q8H, Judith Part, MD, 5,000 Units at 06/26/19 0528 .  HYDROcodone-acetaminophen (NORCO/VICODIN) 5-325 MG per tablet 1 tablet, 1 tablet, Oral, Q4H PRN, Judith Part, MD, 1 tablet at 06/26/19 0030 .  HYDROmorphone (DILAUDID) injection 0.5 mg, 0.5 mg, Intravenous, Q3H PRN, Judith Part, MD, 0.5 mg at 06/24/19 2000 .  hydrOXYzine (ATARAX/VISTARIL) tablet 50 mg, 50 mg, Oral, TID PRN, Judith Part, MD, 50 mg at 06/24/19 2134 .  insulin aspart (novoLOG) injection 0-15 Units, 0-15 Units, Subcutaneous, TID WC, Judith Part, MD, 3 Units at 06/25/19 1733 .  insulin glargine (LANTUS) injection 20 Units, 20 Units, Subcutaneous, QHS, Judith Part, MD, 20 Units at 06/25/19 2132 .  labetalol (NORMODYNE) injection 10-40 mg, 10-40 mg, Intravenous, Q10 min PRN, Judith Part, MD, 20 mg at 06/24/19 0137 .  losartan (COZAAR) tablet 25 mg, 25 mg, Oral, Daily, Judith Part, MD, 25 mg at 06/25/19 0928 .  meclizine (ANTIVERT) tablet 25 mg, 25 mg, Oral, TID PRN, Judith Part, MD .  ondansetron (  ZOFRAN) tablet 4 mg, 4 mg, Oral, Q4H PRN, 4 mg at 06/25/19 1228 **OR** ondansetron (ZOFRAN) injection 4 mg, 4 mg, Intravenous, Q4H PRN, Judith Part, MD, 4 mg at 06/24/19 1027 .  polyethylene glycol (MIRALAX / GLYCOLAX) packet 17 g, 17 g, Oral, Daily PRN, Herold Salguero A, MD .  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 Expected post-op CN5 / CN6 palsies due to nerve manipulation, nerves both in continuinity, should recover well.  11/11 MRI w/ diffusion changes along lateral pons, residual tumor measuring ~0.3cc attached to PCA.  -added on to tumor board for [facility] for residual tumor -PT/OT rec home health PT OT -still having some dizziness with standing, likely related to vestibulocochlear nerve irritation, started meclizine, will see how she does with PT, if cleared by PT then okay for discharge today  -SCDs/TEDs, SQ[facility]  Judith Part  06/26/19 8:06 AM

## 2019-06-26 NOTE — Progress Notes (Signed)
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 Repeat MRI showed: Small amount of residual enhancing tumor at the anterolateral 2 o'clock position relative to the brainstem; Small area of acute ischemia in the left pons, near the resection site; Faint leptomeningeal contrast enhancement along the left pons and left cerebellum, new from the prior study. This may be due to blood-brain barrier breakdown in the postoperative setting. PMH includes chronic headaches, DM, GERd, HTN.   OT comments  Pt much more animated, and reports dizziness/nausea improved with use of Meclazine.  She was instructed in occulomotor and fusion exercises - she demonstrated understanding.  Also discussed need to have assist with tub transfer and use of tub seat.  She and spouse verbalized understanding   Follow Up Recommendations  Home health OT;Supervision/Assistance - 24 hour    Equipment Recommendations  Tub/shower seat    Recommendations for Other Services      Precautions / Restrictions Precautions Precautions: Fall Restrictions Weight Bearing Restrictions: No       Mobility Bed Mobility Overal bed mobility: Needs Assistance Bed Mobility: Sidelying to Sit;Sit to Sidelying   Sidelying to sit: Supervision     Sit to sidelying: Supervision General bed mobility comments: Pt in R sidelying upon arrival to room, supervision for sidelying<>sit for safety. No dizziness reported at EOB.  Transfers Overall transfer level: Needs assistance Equipment used: Rolling walker (2 wheeled) Transfers: Sit to/from Stand Sit to Stand: Min guard;From elevated surface         General transfer comment: Min guard for safety, verbal cuing for  hand placement when rising x1. Sit to stand x3, once from EOB and twice from transport chair pre- and post-stair navigation training.    Balance Overall balance assessment: Needs assistance Sitting-balance support: No upper extremity supported;Feet supported Sitting balance-Leahy Scale: Good     Standing balance support: No upper extremity supported;During functional activity Standing balance-Leahy Scale: Fair Standing balance comment: able to stand without UE support, requires UE support for dynamic standing                           ADL either performed or assessed with clinical judgement   ADL Overall ADL's : Needs assistance/impaired                                   Tub/Shower Transfer Details (indicate cue type and reason): discussed options for tub seat, and to use tub seat at home when showering.  Pt and spouse instructed that she needs assist when stepping over the tub - they verbalized understanding  Functional mobility during ADLs: Min guard       Vision       Perception     Praxis      Cognition Arousal/Alertness: Awake/alert Behavior During Therapy: WFL for tasks assessed/performed Overall Cognitive Status: Within Functional Limits for tasks assessed                                 General Comments: Pt pleasant and talkative with PT, speaks about family and home environment during PT  session. Pt with difficulty with L peripheral vision, limiting spatial awareness during hallway navigation and during transfers, requires verbal cuing for safety awareness.        Exercises Other Exercises Other Exercises: Pt reports occlusion glasses are working well and she uses them when texting and when ambulating.   Other Exercises: Pt instructed in occulomotor exercises and was able to return demonstration  Other Exercises: Pt able to fuse images in central field, instructed her to try to hold fusion and shift gaze just slightly to the  left  - she verbalized understanding     Shoulder Instructions       General Comments HR 90-148 bpm, SpO2 WFL, RR up to ~40 breaths/min, BP 161/109 post-ambulation - RN notified    Pertinent Vitals/ Pain       Pain Assessment: Faces Faces Pain Scale: Hurts little more Pain Location: head, L crani location Pain Descriptors / Indicators: Headache;Operative site guarding;Sore Pain Intervention(s): Monitored during session  Home Living                                          Prior Functioning/Environment              Frequency  Min 2X/week        Progress Toward Goals  OT Goals(current goals can now be found in the care plan section)  Progress towards OT goals: Progressing toward goals  Acute Rehab OT Goals Patient Stated Goal: go home with help of family  Plan Discharge plan remains appropriate    Co-evaluation                 AM-PAC OT "6 Clicks" Daily Activity     Outcome Measure   Help from another person eating meals?: None Help from another person taking care of personal grooming?: A Little Help from another person toileting, which includes using toliet, bedpan, or urinal?: A Little Help from another person bathing (including washing, rinsing, drying)?: A Little Help from another person to put on and taking off regular upper body clothing?: A Little Help from another person to put on and taking off regular lower body clothing?: A Little 6 Click Score: 19    End of Session    OT Visit Diagnosis: Unsteadiness on feet (R26.81);Low vision, both eyes (H54.2)   Activity Tolerance Patient tolerated treatment well   Patient Left in bed;with call bell/phone within reach;with family/visitor present   Nurse Communication          Time: [id number] OT Time Calculation (min): 11 min  Charges: OT General Charges $OT Visit: 1 Visit OT Treatments $Therapeutic Activity: 8-22 mins  Lucille Passy, OTR/L [place] Pager [phone] Office [phone]    Lucille Passy M 06/26/2019, 1:44 PM

## 2019-06-26 NOTE — Progress Notes (Signed)
Occupational Therapy Evaluation (late entry)  Pt admitted for the below listed diagnosis and demonstrates the below listed deficits.  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  She has 8/10 nausea with activity.  She currently requires min guard - min A for ADLs and functional mobility.  She lives with her spouse and has good family support who will be able to provide necessary level of care at discharge.  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  She will benefit from continued OT to allow her to maximize safety and independence with ADLs.  Recommend HHOT initially as she lives in second floor apartment, with transition to [place].  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 Repeat MRI showed: Small amount of residual enhancing tumor at the anterolateral 2 o'clock position relative to the brainstem; Small area of acute ischemia in the left pons, near the resection site; Faint leptomeningeal contrast enhancement along the left pons and left cerebellum, new from the prior study. This may be due to blood-brain barrier breakdown in the postoperative setting. PMH includes chronic headaches, DM, GERd, HTN.  Precautions  Precautions Fall  Home Living  Family/patient expects to be discharged to: Private residence  Living Arrangements Spouse/significant other  Available Help at Discharge Family;Available 24 hours/day  Type of Home Apartment  Home Access Stairs to enter  Entrance Stairs-Number of Steps flight  Entrance Stairs-Rails Right  Home Layout One level  Bathroom Therapist, music None  Additional Comments Pt reports her  spouse will be available for the first few days, then her parents will provide assist/supervision, and then her daughter will stay with her   Prior Function  Level of Independence Independent  Comments 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  Pt somewhat guarded, but becomes more animated during the session.  Would benefit from further cognitive assessment   Upper Extremity Assessment  Upper Extremity Assessment Generalized weakness  Lower Extremity Assessment  Lower Extremity Assessment Overall WFL for tasks assessed  Cervical / Trunk Assessment  Cervical / Trunk Assessment Other exceptions  Cervical / Trunk Exceptions keeps head tilted to the left and slightly flexed   ADL  Overall ADL's  Needs assistance/impaired  Eating/Feeding Modified independent;Sitting  Eating/Feeding Details (indicate cue type and reason) Pt undershoots when eating resulting in frequent spills   Grooming Wash/dry hands;Wash/dry face;Oral care;Min guard;Standing  Upper Body Bathing Set up;Sitting  Lower Body Bathing Minimal assistance;Sit to/from stand  Upper Body Dressing  Set up;Sitting  Lower Body Dressing Minimal assistance;Sit to/from Retail buyer Minimal assistance;Ambulation;Comfort height toilet;Grab bars;RW  Armed forces technical officer Details (indicate cue type and reason) assist to steady as nausea increased her balance decreased and she was mildly  impulsive trying to hurry back to her bed   Toileting- Water quality scientist and  Hygiene Min guard;Sit to/from stand  Functional mobility during ADLs Min guard;Minimal assistance;Rolling walker  General ADL Comments Pt requires assist for balance.  She becomes mildly impulsive as nausea increases   Vision- History  Baseline Vision/History No visual deficits  Patient Visual Report Diplopia  Vision- Assessment  Vision Assessment? Yes  Eye Alignment Impaired (comment)  Ocular Range of Motion Other (comment)  Tracking/Visual Pursuits Other (comment)  Convergence Impaired (comment)  Additional Comments Pt demonstrates 6th nerve palsy with no abduction of Lt eye past midline.  She demonstrates mild horizontal nystagmus Rt eye, and rotational nystagmus Lt eye.  Pt reports diplopia at all times and demonstrates no area of fusion    Perception  Perception Tested? Yes  Praxis  Praxis tested? WFL  Bed Mobility  Overal bed mobility Needs Assistance  Bed Mobility Sidelying to Sit;Sit to Sidelying  Sidelying to sit Supervision  Sit to sidelying Supervision  Transfers  Overall transfer level Needs assistance  Equipment used Rolling walker (2 wheeled)  Transfers Sit to/from [employer] Transfers  Sit to Stand Min guard  Stand pivot transfers Min guard  General transfer comment min guard for safety   Balance  Overall balance assessment Needs assistance  Sitting-balance support No upper extremity supported;Feet supported  Sitting balance-Leahy Scale Good  Standing balance support No upper extremity supported;During functional activity  Standing balance-Leahy Scale Fair  Standing balance comment able to maintain static standing with min guard assist   General Comments  General comments (skin integrity, edema, etc.) Pt with nausea 8/10 with ambulationg to bathroom and back   OT - End of Session  Equipment Utilized During Treatment Rolling walker;Gait belt  Activity Tolerance Other (comment) (nausea)  Patient left in bed;with call bell/phone within reach  Nurse  Communication Other (comment);Mobility status (nausea)  OT Assessment  OT Recommendation/Assessment Patient needs continued OT Services  OT Visit Diagnosis Unsteadiness on feet (R26.81);Low vision, both eyes (H54.2)  OT Problem List Decreased activity tolerance;Impaired vision/perception;Impaired balance (sitting and/or standing);Decreased safety awareness;Decreased knowledge of use of DME or AE  OT Plan  OT Frequency (ACUTE ONLY) Min 2X/week  OT Treatment/Interventions (ACUTE ONLY) Self-care/ADL training;DME and/or AE instruction;Therapeutic activities;Visual/perceptual remediation/compensation;Patient/family education;Balance training;Cognitive remediation/compensation  AM-PAC OT "6 Clicks" Daily Activity Outcome Measure (Version 2)  Help from another person eating meals? 4  Help from another person taking care of personal grooming? 3  Help from another person toileting, which includes using toliet, bedpan, or urinal? 3  Help from another person bathing (including washing, rinsing, drying)? 3  Help from another person to put on and taking off regular upper body clothing? 3  Help from another person to put on and taking off regular lower body clothing? 3  6 Click Score 19  OT Recommendation  Follow Up Recommendations Home health OT;Supervision/Assistance - 24 hour (progressing to OPOT)  OT Equipment Tub/shower seat  Individuals Consulted  Consulted and Agree with Results and Recommendations Patient  Acute Rehab OT Goals  Patient Stated Goal to go home   OT Goal Formulation With patient  Time For Goal Achievement 07/10/19  Potential to Achieve Goals Good  OT Time Calculation  OT Start Time (ACUTE ONLY) 1154  OT Stop Time (ACUTE ONLY) 1222  OT Time Calculation (min) 28 min  OT General Charges  $OT Visit 1 Visit  OT Evaluation  $OT Eval Moderate Complexity 1 Mod  OT Treatments  $Self Care/Home Management  8-22 mins  Written Expression  Dominant Hand Right  Lucille Passy,  OTR/L Acute Rehabilitation Services Pager [phone] Office [phone]

## 2019-06-26 NOTE — Progress Notes (Signed)
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 Repeat MRI showed: Small amount of residual enhancing tumor at the anterolateral 2 o'clock position relative to the brainstem; Small area of acute ischemia in the left pons, near the resection site; Faint leptomeningeal contrast enhancement along the left pons and left cerebellum, new from the prior study. This may be due to blood-brain barrier breakdown in the postoperative setting. PMH includes chronic headaches, DM, GERd, HTN.    PT Comments    Pt given meclizine prior to mobility with PT this morning, pt with no complaints of vertigo-like dizziness this session. Pt also utilized occlusion glasses given to her by OT yesterday with no diplopia noted during session. Pt ambulated much further today, a total of 130 ft with RW, with min assist for directing pt and RW due to veering L/R in hallway. Pt with no LOB, but does require CGA for direction assist, per pt family will be helping her with this upon Soto/c. Pt successfully completed a flight of stairs with PT today with min guard assist, PT providing education for pt on how caregivers should guard and assist her during stair navigation.   Of note, pt with HR up to 148 bpm during session, with BP of 161/109 post-ambulation, RN notified.    Follow Up Recommendations  Home health PT;Supervision/Assistance - 24 hour     Equipment Recommendations  Rolling walker with 5" wheels    Recommendations for Other Services       Precautions / Restrictions Precautions Precautions: Fall Restrictions Weight Bearing Restrictions: No    Mobility  Bed Mobility Overal bed mobility: Needs Assistance Bed Mobility: Sidelying to Sit;Sit to  Sidelying   Sidelying to sit: Supervision     Sit to sidelying: Supervision General bed mobility comments: Pt in R sidelying upon arrival to room, supervision for sidelying<>sit for safety. No dizziness reported at EOB.  Transfers Overall transfer level: Needs assistance Equipment used: Rolling walker (2 wheeled) Transfers: Sit to/from Stand Sit to Stand: Min guard;From elevated surface         General transfer comment: Min guard for safety, verbal cuing for hand placement when rising x1. Sit to stand x3, once from EOB and twice from transport chair pre- and post-stair navigation training.  Ambulation/Gait Ambulation/Gait assistance: Min assist Gait Distance (Feet): 130 Feet(80+50 ft) Assistive device: Rolling walker (2 wheeled) Gait Pattern/deviations: Step-through pattern;Decreased stride length;Staggering left;Staggering right Gait velocity: decr   General Gait Details: min assist for directing pt, cuing to RW placement and watching for hallway obstacles/walls. Pt veering L and R, with more veering towards R today. PT educated pt on hallway scanning due to peripheral issues, pt with use of occlusion lenses with Lt temporal lens occluded provided by OT yesterday.   Stairs Stairs: Yes Stairs assistance: Min guard Stair Management: One rail Right;Two rails;Step to pattern;Forwards Number of Stairs: 12 General stair comments: min guard for safety, verbal cuing for step-to gait, self-steadying with use of railing, and taking time to prevent missteps. Pt educated on family member guarding (family member posterior and lower than her on steps to prevent falls).   Wheelchair Mobility    Modified Rankin (Stroke Patients Only)       Balance Overall balance assessment: Needs assistance Sitting-balance  support: No upper extremity supported;Feet supported Sitting balance-Leahy Scale: Good     Standing balance support: No upper extremity supported;During functional  activity Standing balance-Leahy Scale: Fair Standing balance comment: able to stand without UE support, requires UE support for dynamic standing                            Cognition Arousal/Alertness: Awake/alert Behavior During Therapy: WFL for tasks assessed/performed Overall Cognitive Status: Within Functional Limits for tasks assessed                                 General Comments: Pt pleasant and talkative with PT, speaks about family and home environment during PT session. Pt with difficulty with L peripheral vision, limiting spatial awareness during hallway navigation and during transfers, requires verbal cuing for safety awareness.      Exercises      General Comments General comments (skin integrity, edema, etc.): HR 90-148 bpm, SpO2 WFL, RR up to ~40 breaths/min, BP 161/109 post-ambulation - RN notified      Pertinent Vitals/Pain Pain Assessment: Faces Faces Pain Scale: Hurts little more Pain Location: head, L crani location Pain Descriptors / Indicators: Headache;Operative site guarding;Sore Pain Intervention(s): Limited activity within patient's tolerance;Monitored during session;Premedicated before session;Repositioned    Home Living                      Prior Function            PT Goals (current goals can now be found in the care plan section) Acute Rehab PT Goals Patient Stated Goal: go home with help of family Time For Goal Achievement: 07/08/19 Potential to Achieve Goals: Good Progress towards PT goals: Progressing toward goals    Frequency    Min 3X/week      PT Plan Current plan remains appropriate    Co-evaluation              AM-PAC PT "6 Clicks" Mobility   Outcome Measure  Help needed turning from your back to your side while in a flat bed without using bedrails?: None Help needed moving from lying on your back to sitting on the side of a flat bed without using bedrails?: None Help needed moving  to and from a bed to a chair (including a wheelchair)?: A Little Help needed standing up from a chair using your arms (Soto.g., wheelchair or bedside chair)?: A Little Help needed to walk in hospital room?: A Little Help needed climbing 3-5 steps with a railing? : A Little 6 Click Score: 20    End of Session Equipment Utilized During Treatment: Gait belt Activity Tolerance: Patient limited by fatigue Patient left: with call bell/phone within reach;in bed;with bed alarm set Nurse Communication: Mobility status;Other (comment)(HR and BP during and post-ambulation) PT Visit Diagnosis: Difficulty in walking, not elsewhere classified (R26.2);Unsteadiness on feet (R26.81);Dizziness and giddiness (R42)     Time: [id number] PT Time Calculation (min) (ACUTE ONLY): 26 min  Charges:  $Gait Training: 23-37 mins                     Tricia Soto, PT [place] Pager [phone]  Office [phone]    Tricia Soto [place] 06/26/2019, 10:53 AM

## 2019-06-26 NOTE — TOC Transition Note (Signed)
Transition of Care [facility]) - CM/SW Discharge Note Marvetta Gibbons RN,BSN Transitions of Care Unit 4NP (non trauma) - RN Case Manager [phone]    Patient Details  Name: Tricia Soto MRN: [id number] Date of Birth: 01/10/1976  Transition of Care [facility]) CM/SW Contact:  Dawayne Patricia, RN Phone Number: 06/26/2019, 4:02 PM   Clinical Narrative:    Pt stable for transition home today per PT/OT evals recs for [facility] and DME- RW-orders have been placed- CM spoke with pt at bedside- list provided Per CMS guidelines from medicare.gov website with star ratings (copy placed in shadow chart)- as pt is [employer] will need to go through [place] to arrange [place] [phone] ext [phone]) confirmed. Call made to [facility] for [facility] needs referral- spoke with Cammie- faxed needed paperwork- they will start to work on contracting an agency- per pt she does not have a preference as long a [place] approves. [facility] intake reference # is- [id number].  Call made to [facility] with [place] for DME need- RW to be delivered to room prior to discharge.    Final next level of care: [place] Barriers to Discharge: No Barriers Identified   Patient Goals and CMS Choice Patient states their goals for this hospitalization and ongoing recovery are:: to return home CMS Medicare.gov Compare Post Acute Care list provided to:: Patient Choice offered to / list presented to : Patient  Discharge Placement                       Discharge Plan and Services   Discharge Planning Services: CM Consult Post Acute Care Choice: Durable Medical Equipment, Home Health          DME Arranged: Walker rolling DME Agency: [employer] Date DME Agency Contacted: 06/26/19 Time DME Agency Contacted: [phone] Representative spoke with at DME Agency: [place]: PT, OT [place] Agency: (referral to [facility] per Christella Scheuermann)        Social Determinants of Health (SDOH)  Interventions     Readmission Risk Interventions Readmission Risk Prevention Plan 06/26/2019  Post Dischage Appt Complete  Medication Screening Complete  Transportation Screening Complete  Some recent data might be hidden

## 2019-06-26 NOTE — Progress Notes (Signed)
Occupational Therapy Progress Note (late entry)  Pt proved with occlusion lenses with Lt temporal lens occluded to reduce diplopia while allowing for binocularity and peripheral input to improve balance and verticality.  She was also provided with a patch with instruction to alternate every 2 hours if she chooses to use it. 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 Repeat MRI showed: Small amount of residual enhancing tumor at the anterolateral 2 o'clock position relative to the brainstem; Small area of acute ischemia in the left pons, near the resection site; Faint leptomeningeal contrast enhancement along the left pons and left cerebellum, new from the prior study. This may be due to blood-brain barrier breakdown in the postoperative setting. PMH includes chronic headaches, DM, GERd, HTN.  Precautions  Precautions Fall  Pain Assessment  Pain Assessment Faces  Faces Pain Scale 4  Pain Location head  Pain Descriptors / Indicators Headache;Operative site guarding  Pain Intervention(s) Monitored during session  Cognition  Arousal/Alertness Awake/alert  Behavior During Therapy Flat affect;WFL for tasks assessed/performed  Overall Cognitive Status Within Functional Limits for tasks assessed  Upper Extremity Assessment  Upper Extremity Assessment Generalized weakness  Lower Extremity Assessment  Lower Extremity Assessment Defer to PT evaluation  Restrictions  Weight Bearing Restrictions No  Vision- Assessment  Additional Comments Pt able to use phone to text by partially closing Lt eye to reduce diplopia  Exercises  Exercises Other exercises  Other Exercises   Other Exercises Pt is Rt eye dominant.  She was proved with occlusion lenses with Lt temporal lens occluded.  Pt reports no diplopia with occlusion glasses.  She was also provided with a patch and instructed to alternate it every two hours if she wears it. She verbalized understanding.  SHe was provided with diplopia hand out.    OT Assessment/Plan  OT Plan Discharge plan remains appropriate  OT Visit Diagnosis Unsteadiness on feet (R26.81);Low vision, both eyes (H54.2)  OT Frequency (ACUTE ONLY) Min 2X/week  Follow Up Recommendations Home health OT;Supervision/Assistance - 24 hour  OT Equipment Tub/shower seat  AM-PAC OT "6 Clicks" Daily Activity Outcome Measure (Version 2)  Help from another person eating meals? 4  Help from another person taking care of personal grooming? 3  Help from another person toileting, which includes using toliet, bedpan, or urinal? 3  Help from another person bathing (including washing, rinsing, drying)? 3  Help from another person to put on and taking off regular upper body clothing? 3  Help from another person to put on and taking off regular lower body clothing? 3  6 Click Score 19  OT Goal Progression  Progress towards OT goals Progressing toward goals  OT Time Calculation  OT Start Time (ACUTE ONLY) 1310  OT Stop Time (ACUTE ONLY) 1324  OT Time Calculation (min) 14 min  OT General Charges  $OT Visit 1 Visit  OT Treatments  $Therapeutic Activity 8-22 mins  Lucille Passy, OTR/L Acute Rehabilitation Services Pager [phone] Office [phone]

## 2019-06-29 ENCOUNTER — Other Ambulatory Visit: Payer: Self-pay | Admitting: Radiation Therapy

## 2019-06-29 DIAGNOSIS — D329 Benign neoplasm of meninges, unspecified: Secondary | ICD-10-CM

## 2019-06-29 NOTE — Discharge Summary (Signed)
Discharge Summary  Date of Admission: 06/23/2019  Date of Discharge: 06/29/19  Attending Physician: Emelda Brothers, MD  Hospital Course: Patient was admitted following an uncomplicated retrosigmoid craniotomy for resection of a petroclival meningioma. She was recovered in PACU and transferred to 4N. Post-operatively, as expected, she had some diplopia due to a 6th nerve palsy with and some facial numbness from a 5th nerve palsy but with intact corneal sensation. A post-op MRI showed good subtotal resection of the mass with a small residual. Her hospital course was uncomplicated and the patient was discharged home on 06/26/2019. She will follow up in clinic with me in 2 weeks.  Neurologic exam at discharge:  AOx3, PERRL, EOMI except for L 6th nerve palsy, FS with decreased sensation on the L in V2-V3, TM Strength 5/5 x4, SILTx4, no drift  Discharge diagnosis: petroclival meningioma  Judith Part, MD 06/29/19 5:12 PM

## 2019-07-21 NOTE — Progress Notes (Signed)
Location/Histology of Brain Tumor:SURGICAL PATHOLOGY  CASE: [id number]  PATIENT: Tricia Soto  Surgical Pathology Report  clinical History: brain tumor (cm)  FINAL MICROSCOPIC DIAGNOSIS:   A. BRAIN, LEFT POSTERIOR FOSSA, RESECTION:  - Consistent with meningioma, WHO grade I.  - See comment.   COMMENT:   - The tumor cells are positive with vimentin, EMA, and progesterone  receptor. They are negative for inhibin, NSE, GFAP, and S100. This  profile supports the above diagnosis. Additional studies can be  performed upon clinician request.   Patient presented with symptoms of:  : 43 y.o. woman with history of worsening headaches, which prompted further workup and discovered a skull base tumor, likely meningioma, here for surgical resection. No changes in health since she was last seen. Still having headaches and wishes to proceed with surgery.  Past or anticipated interventions, if any, per neurosurgery: resection   Past or anticipated interventions, if any, per medical oncology: none  Dose of Decadron, if applicable: no  Recent neurologic symptoms, if any:   Seizures: no  Headaches: yes  Nausea: no  Dizziness/ataxia: no  Difficulty with hand coordination: no  Focal numbness/weakness: no  Visual deficits/changes: no  Confusion/Memory deficits: no    SAFETY ISSUES:  Prior radiation? no  Pacemaker/ICD? no  Possible current pregnancy? no  Is the patient on methotrexate? no  Additional Complaints / other details: Doing better no complaints of

## 2019-07-23 ENCOUNTER — Other Ambulatory Visit: Payer: Self-pay

## 2019-07-23 ENCOUNTER — Encounter: Payer: Self-pay | Admitting: Radiation Oncology

## 2019-07-23 ENCOUNTER — Ambulatory Visit
Admission: RE | Admit: 2019-07-23 | Discharge: 2019-07-23 | Disposition: A | Discharge status: Home / Self Care | Payer: Managed Care, Other (non HMO) | Source: Ambulatory Visit | Attending: Radiation Oncology | Admitting: Radiation Oncology

## 2019-07-23 DIAGNOSIS — Z308 Encounter for other contraceptive management: Secondary | ICD-10-CM

## 2019-07-23 DIAGNOSIS — D329 Benign neoplasm of meninges, unspecified: Secondary | ICD-10-CM

## 2019-07-23 NOTE — Progress Notes (Addendum)
Radiation Oncology         [phone] ________________________________  Name: Tricia Soto        MRN: [id number]  Date of Service: 07/23/2019 DOB: 10/30/1975  [id number], Ismael, DO  Ostergard, Tricia Faster, MD     REFERRING PHYSICIAN: Judith Part, MD   DIAGNOSIS: The encounter diagnosis was Meningioma [facility]).   HISTORY OF PRESENT ILLNESS: Tricia Soto is a 43 y.o. female seen at the request of Dr. Zada Finders for a recent diagnosis of a grade 1 meningioma. The patient presented with symptoms of left sided headaches and was found to have a mass in the left petroclival region superior to the trigeminal nerve. This measured 15 x 16 x 20 mm on her MRI on 04/22/2019. She proceeded with resection of this with Dr. Zada Finders on 06/23/2019. Final pathology revealed a grade 1 meningioma. Postop MRI on 06/24/2019 revealed mild ehnahcement in the anterolatera 2:00 position relative to the brainstem measuring 1.2 x .8 cm. She was discussed in brain oncology conference and it was recommended that she proceed with [facility] treatment and is contacted today via Doximity after MyChart was not launching properly.     PREVIOUS RADIATION THERAPY: No   PAST MEDICAL HISTORY:  Past Medical History:  Diagnosis Date  . Chronic headaches   . Diabetes mellitus without complication ([place])   . GERD (gastroesophageal reflux disease)   . Hypertension   . Meningioma ([place])        PAST SURGICAL HISTORY: Past Surgical History:  Procedure Laterality Date  . APPLICATION OF CRANIAL NAVIGATION N/A 06/23/2019   Procedure: APPLICATION OF CRANIAL NAVIGATION;  Surgeon: Tricia Part, MD;  Location: [place];  Service: Neurosurgery;  Laterality: N/A;  . CRANIOTOMY Left 06/23/2019   Procedure: Left craniotomy for tumor resection with brainlab/facial nerve monitoring;  Surgeon: Tricia Part, MD;  Location: [facility];  Service: Neurosurgery;  Laterality: Left;  . THYROIDECTOMY, PARTIAL    . TUBAL LIGATION     1997    . tubal ligation reversal  2013     FAMILY HISTORY: No family history on file.   SOCIAL HISTORY:  reports that she has never smoked. She has never used smokeless tobacco. She reports previous alcohol use. She reports that she does not use drugs. The patient is married and lives in [place] with her husband and her daughter.    ALLERGIES: Lisinopril   MEDICATIONS:  Current Outpatient Medications  Medication Sig Dispense Refill  . amitriptyline (ELAVIL) 10 MG tablet Take 10 mg by mouth at bedtime.    . gabapentin (NEURONTIN) 100 MG capsule Take 100 mg by mouth at bedtime.    Marland Kitchen HYDROcodone-acetaminophen (NORCO/VICODIN) 5-325 MG tablet Take 1 tablet by mouth every 4 (four) hours as needed (pain). 30 tablet 0  . hydrOXYzine (ATARAX/VISTARIL) 50 MG tablet Take 50 mg by mouth 3 (three) times daily as needed for anxiety.     . hydrOXYzine (VISTARIL) 50 MG capsule Take by mouth.    . insulin degludec (TRESIBA FLEXTOUCH) 100 UNIT/ML SOPN FlexTouch Pen Inject into the skin.    Marland Kitchen insulin lispro (HUMALOG KWIKPEN) 100 UNIT/ML KwikPen Inject 4-8 Units into the skin 3 (three) times daily as needed (blood sugar 150 or above).    . Liraglutide -Weight Management (SAXENDA) 18 MG/3ML SOPN Inject 3 mg into the skin daily.     Marland Kitchen losartan (COZAAR) 25 MG tablet Take 25 mg by mouth daily.    . meclizine (ANTIVERT) 25 MG tablet Take  1 tablet (25 mg total) by mouth 3 (three) times daily as needed for dizziness. 30 tablet 0  . ondansetron (ZOFRAN) 4 MG tablet Take 1 tablet (4 mg total) by mouth every 8 (eight) hours as needed for nausea or vomiting. 30 tablet 1  . TRESIBA FLEXTOUCH 100 UNIT/ML SOPN FlexTouch Pen Inject 20 Units into the skin at bedtime.      No current facility-administered medications for this encounter.     REVIEW OF SYSTEMS: On review of systems, the patient reports that she is doing well overall. She reports her left eye is still somewhat fixed medially but she is otherwise feeling  quite well and denies any visual disturbance despite this. She denies any chest pain, shortness of breath, cough, fevers, chills, night sweats, unintended weight changes. She denies any bowel or bladder disturbances, and denies abdominal pain, nausea or vomiting. She denies any new musculoskeletal or joint aches or pains. A complete review of systems is obtained and is otherwise negative.     PHYSICAL EXAM:  In general this is a well appearing caucasian female in no acute distress. She's alert and oriented x4 and appropriate throughout the examination. Cardiopulmonary assessment is negative for acute distress and she exhibits normal effort.   ECOG = 0  0 - Asymptomatic (Fully active, able to carry on all predisease activities without restriction)  1 - Symptomatic but completely ambulatory (Restricted in physically strenuous activity but ambulatory and able to carry out work of a light or sedentary nature. For example, light housework, office work)  2 - Symptomatic, <50% in bed during the day (Ambulatory and capable of all self care but unable to carry out any work activities. Up and about more than 50% of waking hours)  3 - Symptomatic, >50% in bed, but not bedbound (Capable of only limited self-care, confined to bed or chair 50% or more of waking hours)  4 - Bedbound (Completely disabled. Cannot carry on any self-care. Totally confined to bed or chair)  5 - Death   Eustace Pen MM, Creech RH, Tormey DC, et al. [phone]). "Toxicity and response criteria of the [facility]". [place] Oncol. 5 (6): 649-55    LABORATORY DATA:  Lab Results  Component Value Date   WBC 15.4 (H) 06/23/2019   HGB 10.3 (L) 06/23/2019   HCT 31.2 (L) 06/23/2019   MCV 86.0 06/23/2019   PLT 233 06/23/2019   Lab Results  Component Value Date   NA 141 06/23/2019   K 3.9 06/23/2019   CL 109 06/19/2019   CO2 25 06/19/2019   No results found for: ALT, AST, GGT, ALKPHOS, BILITOT     RADIOGRAPHY: MR BRAIN W WO CONTRAST  Result Date: 06/24/2019 CLINICAL DATA:  Brain tumor follow-up EXAM: MRI HEAD WITHOUT AND WITH CONTRAST TECHNIQUE: Multiplanar, multiecho pulse sequences of the brain and surrounding structures were obtained without and with intravenous contrast. CONTRAST:  49mL GADAVIST GADOBUTROL 1 MMOL/ML IV SOLN COMPARISON:  Brain MRI 04/22/2019 FINDINGS: BRAIN: There is a small focus of acute ischemia in the lateral left midbrain/pons. There is associated hyperintense T2-weighted signal in this location and within the anterior left cerebellar hemisphere. The cerebral and cerebellar volume are age-appropriate. There is no hydrocephalus. There is a small amount of residual enhancing tumor at the anterolateral 2 o'clock position relative to the brainstem (series 27 image 23), measuring 1.2 x 0.8 cm. There is faint leptomeningeal contrast enhancement along the left pons and in the left cerebellum, new from the  prior study. VASCULAR: The major intracranial arterial and venous sinus flow voids are normal. Susceptibility-sensitive sequences show no chronic microhemorrhage or superficial siderosis. SKULL AND UPPER CERVICAL SPINE: Left retromastoid craniectomy. SINUSES/ORBITS: Bilateral mastoid fluid, much greater on the left. The orbits are normal. IMPRESSION: 1. Small amount of residual enhancing tumor at the anterolateral 2 o'clock position relative to the brainstem. 2. Small area of acute ischemia in the left pons, near the resection site. 3. Faint leptomeningeal contrast enhancement along the left pons and left cerebellum, new from the prior study. This may be due to blood-brain barrier breakdown in the postoperative setting. Electronically Signed   By: Ulyses Jarred M.D.   On: 06/24/2019 21:43       IMPRESSION/PLAN: 1. Grade 1 meningioma with residual enhancement following resection. Dr. Lisbeth Renshaw discusses the pathology findings and reviews the nature of meningiomatous disease. He  discusses the rationale to use radiotherapy to prevent further progression of the remaining meningioma and would recommend a course of stereotactic radiosurgery [facility]). He agrees with repeating her MRI with 3T protocol for planning purposes which is scheduled for tomorrow. We discussed the risks, benefits, short, and long term effects of radiotherapy, and the patient is interested in proceeding. Dr. Lisbeth Renshaw discusses the delivery and logistics of radiotherapy and anticipates a course of 1 fraction of SRS radiotherapy. We will se her tomorrow for simulation and will sign written consent at that time. She is also counseled on the role Dr. Mickeal Skinner will play in her long term follow up and that he will see her following her first post treatment scan. 2. Contraceptive counseling. She is not sexually active and has not been since prior to her surgery. For this reason she declines urine pregnancy testing, but is aware of the risks it could cause a developing fetus if she were to become pregnant. She is aware that between today and Tuesday, if she becomes active, she needs to use barrier contraception.  This encounter was provided by telemedicine platform Doximity.  The patient has given verbal consent for this type of encounter and has been advised to only accept a meeting of this type in a secure network environment. The time spent during this encounter was 30 minutes. The attendants for this meeting include  Dr. Lisbeth Renshaw, Hayden Pedro  and Rise Mu.  During the encounter, Dr. Lisbeth Renshaw, and Hayden Pedro were located at [facility].  Rise Mu was located at home.    The above documentation reflects my direct findings during this shared patient visit. Please see the separate note by Dr. Lisbeth Renshaw on this date for the remainder of the patient's plan of care.    Carola Rhine, PAC

## 2019-07-23 NOTE — Patient Instructions (Signed)
Coronavirus (COVID-19) Are you at risk?  Are you at risk for the Coronavirus (COVID-19)?  To be considered HIGH RISK for Coronavirus (COVID-19), you have to meet the following criteria:  . Traveled to [place], [place], [place], [place] or [place]; or in the [place] to [place], [place], [place], or [place]; and have fever, cough, and shortness of breath within the last 2 weeks of travel OR . Been in close contact with a person diagnosed with COVID-19 within the last 2 weeks and have fever, cough, and shortness of breath . IF YOU DO NOT MEET THESE CRITERIA, YOU ARE CONSIDERED LOW RISK FOR COVID-19.  What to do if you are HIGH RISK for COVID-19?  . If you are having a medical emergency, call 911. . Seek medical care right away. Before you go to a doctor's office, urgent care or emergency department, call ahead and tell them about your recent travel, contact with someone diagnosed with COVID-19, and your symptoms. You should receive instructions from your physician's office regarding next steps of care.  . When you arrive at healthcare provider, tell the healthcare staff immediately you have returned from visiting [place], [place], [place], [place] or [place]; or traveled in the [place] to [place], [place], [place], or [place]; in the last two weeks or you have been in close contact with a person diagnosed with COVID-19 in the last 2 weeks.   . Tell the health care staff about your symptoms: fever, cough and shortness of breath. . After you have been seen by a medical provider, you will be either: o Tested for (COVID-19) and discharged home on quarantine except to seek medical care if symptoms worsen, and asked to  - Stay home and avoid contact with others until you get your results (4-5 days)  - Avoid travel on public transportation if possible (such as bus, train, or airplane) or o Sent to the Emergency Department by EMS for evaluation, COVID-19 testing, and possible  admission depending on your condition and test results.  What to do if you are LOW RISK for COVID-19?  Reduce your risk of any infection by using the same precautions used for avoiding the common cold or flu:  . Wash your hands often with soap and warm water for at least 20 seconds.  If soap and water are not readily available, use an alcohol-based hand sanitizer with at least 60% alcohol.  . If coughing or sneezing, cover your mouth and nose by coughing or sneezing into the elbow areas of your shirt or coat, into a tissue or into your sleeve (not your hands). . Avoid shaking hands with others and consider head nods or verbal greetings only. . Avoid touching your eyes, nose, or mouth with unwashed hands.  . Avoid close contact with people who are sick. . Avoid places or events with large numbers of people in one location, like concerts or sporting events. . Carefully consider travel plans you have or are making. . If you are planning any travel outside or inside the [place], visit the CDC's Travelers' Health webpage for the latest health notices. . If you have some symptoms but not all symptoms, continue to monitor at home and seek medical attention if your symptoms worsen. . If you are having a medical emergency, call 911.   ADDITIONAL HEALTHCARE OPTIONS FOR PATIENTS  [facility] / e-Visit: [email or web address]         [facility]: [phone]  [facility]: [phone]                   [facility]: [phone]   

## 2019-07-24 ENCOUNTER — Ambulatory Visit
Admission: RE | Admit: 2019-07-24 | Discharge: 2019-07-24 | Disposition: A | Discharge status: Home / Self Care | Payer: Managed Care, Other (non HMO) | Source: Ambulatory Visit | Attending: Radiation Oncology | Admitting: Radiation Oncology

## 2019-07-24 ENCOUNTER — Other Ambulatory Visit: Payer: Self-pay | Admitting: Radiation Therapy

## 2019-07-24 ENCOUNTER — Other Ambulatory Visit: Payer: Self-pay

## 2019-07-24 VITALS — BP 150/95 | HR 96 | Temp 99.1°F | Resp 20 | Wt 224.2 lb

## 2019-07-24 DIAGNOSIS — Z510 Encounter for antineoplastic radiation therapy: Secondary | ICD-10-CM | POA: Diagnosis not present

## 2019-07-24 DIAGNOSIS — D329 Benign neoplasm of meninges, unspecified: Secondary | ICD-10-CM

## 2019-07-24 DIAGNOSIS — D320 Benign neoplasm of cerebral meninges: Secondary | ICD-10-CM | POA: Diagnosis present

## 2019-07-24 MED ORDER — SODIUM CHLORIDE 0.9% FLUSH
10.0000 mL | Freq: Once | INTRAVENOUS | Status: AC
  Administered 2019-07-24: 10 mL via INTRAVENOUS

## 2019-07-24 MED ORDER — GADOBENATE DIMEGLUMINE 529 MG/ML IV SOLN
20.0000 mL | Freq: Once | INTRAVENOUS | Status: AC | PRN
  Administered 2019-07-24: 20 mL via INTRAVENOUS

## 2019-07-24 NOTE — Progress Notes (Signed)
Has armband been applied?  Yes.    Does patient have an allergy to IV contrast dye?: No.   Has patient ever received premedication for IV contrast dye?: No.   Does patient take metformin?: No.  If patient does take metformin when was the last dose: n/a    IV site: antecubital right, condition no redness  Has IV site been added to flowsheet?  Yes.    BP (!) 150/95 (BP Location: Left Arm, Patient Position: Sitting, Cuff Size: Large)   Pulse 96   Temp 99.1 F (37.3 C)   Resp 20   Wt 224 lb 3.2 oz (101.7 kg)   SpO2 100%   BMI 34.09 kg/m

## 2019-07-30 ENCOUNTER — Emergency Department (HOSPITAL_COMMUNITY)
Admission: EM | Admit: 2019-07-30 | Discharge: 2019-07-31 | Disposition: A | Discharge status: Home / Self Care | Payer: Managed Care, Other (non HMO) | Source: Home / Self Care | Attending: Emergency Medicine | Admitting: Emergency Medicine

## 2019-07-30 ENCOUNTER — Encounter (HOSPITAL_COMMUNITY): Payer: Self-pay

## 2019-07-30 ENCOUNTER — Ambulatory Visit
Admission: RE | Admit: 2019-07-30 | Discharge: 2019-07-30 | Disposition: A | Discharge status: Home / Self Care | Payer: Managed Care, Other (non HMO) | Source: Ambulatory Visit | Attending: Radiation Oncology | Admitting: Radiation Oncology

## 2019-07-30 ENCOUNTER — Other Ambulatory Visit: Payer: Self-pay

## 2019-07-30 ENCOUNTER — Emergency Department (HOSPITAL_COMMUNITY): Payer: Managed Care, Other (non HMO)

## 2019-07-30 VITALS — BP 144/89 | HR 88 | Resp 20

## 2019-07-30 DIAGNOSIS — D320 Benign neoplasm of cerebral meninges: Secondary | ICD-10-CM

## 2019-07-30 DIAGNOSIS — R519 Headache, unspecified: Secondary | ICD-10-CM | POA: Insufficient documentation

## 2019-07-30 DIAGNOSIS — E119 Type 2 diabetes mellitus without complications: Secondary | ICD-10-CM | POA: Insufficient documentation

## 2019-07-30 DIAGNOSIS — Z794 Long term (current) use of insulin: Secondary | ICD-10-CM | POA: Insufficient documentation

## 2019-07-30 DIAGNOSIS — I10 Essential (primary) hypertension: Secondary | ICD-10-CM | POA: Insufficient documentation

## 2019-07-30 DIAGNOSIS — Z79899 Other long term (current) drug therapy: Secondary | ICD-10-CM | POA: Insufficient documentation

## 2019-07-30 MED ORDER — DIPHENHYDRAMINE HCL 50 MG/ML IJ SOLN
25.0000 mg | Freq: Once | INTRAMUSCULAR | Status: AC
  Administered 2019-07-31: 25 mg via INTRAVENOUS
  Filled 2019-07-30: qty 1

## 2019-07-30 MED ORDER — DEXAMETHASONE SODIUM PHOSPHATE 10 MG/ML IJ SOLN
10.0000 mg | Freq: Once | INTRAMUSCULAR | Status: AC
  Administered 2019-07-31: 10 mg via INTRAVENOUS
  Filled 2019-07-30: qty 1

## 2019-07-30 MED ORDER — PROCHLORPERAZINE EDISYLATE 10 MG/2ML IJ SOLN
10.0000 mg | Freq: Once | INTRAMUSCULAR | Status: AC
  Administered 2019-07-31: 10 mg via INTRAVENOUS
  Filled 2019-07-30: qty 2

## 2019-07-30 NOTE — Addendum Note (Signed)
Encounter addended by: Ross Marcus, RN on: 07/30/2019 1:46 PM  Actions taken: Vitals modified, Clinical Note Signed

## 2019-07-30 NOTE — ED Triage Notes (Signed)
Pt reports a severe headache that started after her radiation earlier today. Pt had brain surgery Nov. 10. A&Ox4. Endorses nausea.

## 2019-07-30 NOTE — Progress Notes (Signed)
Patient in post SRS to brain. Answers questions appropriately. Understands discharge instructions and when to call if necessary. She will be observed for 30 minutes then discharged into care of her husband.

## 2019-07-30 NOTE — Progress Notes (Signed)
  Radiation Oncology         [phone] ________________________________  Name: Tricia Soto MRN: [id number]  Date: 07/30/2019  DOB: 1976/06/04   SPECIAL TREATMENT PROCEDURE   3D TREATMENT PLANNING AND DOSIMETRY: The patient's radiation plan was reviewed and approved by Dr. Zada Finders from neurosurgery and radiation oncology prior to treatment. It showed 3-dimensional radiation distributions overlaid onto the planning CT/MRI image set. The [facility] for the target structures as well as the organs at risk were reviewed. The documentation of the 3D plan and dosimetry are filed in the radiation oncology EMR.   NARRATIVE: The patient was brought to the TrueBeam stereotactic radiation treatment machine and placed supine on the CT couch. The head frame was applied, and the patient was set up for stereotactic radiosurgery. Neurosurgery was present for the set-up and delivery   SIMULATION VERIFICATION: In the couch zero-angle position, the patient underwent Exactrac imaging using the Brainlab system with orthogonal KV images. These were carefully aligned and repeated to confirm treatment position for each of the isocenters. The Exactrac snap film verification was repeated at each couch angle.   SPECIAL TREATMENT PROCEDURE: The patient received stereotactic radiosurgery to the following target:  PTV1 target was treated using 4 Arcs to a prescription dose of 14 Gy. ExacTrac Snap verification was performed for each couch angle.   STEREOTACTIC TREATMENT MANAGEMENT: Following delivery, the patient was transported to nursing in stable condition and monitored for possible acute effects. Vital signs were recorded . The patient tolerated treatment without significant acute effects, and was discharged to home in stable condition.  PLAN: Follow-up in one month.   ------------------------------------------------  Jodelle Gross, MD, PhD

## 2019-07-30 NOTE — ED Provider Notes (Signed)
[facility] DEPT Provider Note  CSN: [id number] Arrival date & time: 07/30/19 2106  Chief Complaint(s) Headache  HPI Tricia Soto is a 43 y.o. 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  She describes this headache as gradual onset shooting pain along the left temple and a deep ache along the left medial periorbital/nasal region.  Pain is moderate to severe.  No alleviating or aggravating factors.  She has attempted to take prescription narcotic medication without relief.  She endorses mild nausea without emesis.  There is no visual changes.  No fevers or chills.  Denies any other physical complaints.  Patient reports that she has left lateral ocular movement deficits with related double vision and disequilibrium that has been ongoing since surgery.  No acute changes following radiation.  HPI  Past Medical History Past Medical History:  Diagnosis Date  . Chronic headaches   . Diabetes mellitus without complication ([place])   . GERD (gastroesophageal reflux disease)   . Hypertension   . Meningioma [facility])    Patient Active Problem List   Diagnosis Date Noted  . Benign tumor of cerebral meninges ([place]) 06/23/2019  . Brain tumor ([place]) 06/23/2019  . Hypertension 08/20/2017  . Migraine headache 10/15/2011  . Insulin dependent type 2 diabetes mellitus ([place]) 10/15/2011  . Type II or unspecified type diabetes mellitus without mention of complication, not stated as uncontrolled 09/22/2009   Home Medication(s) Prior to Admission medications   Medication Sig Start Date End Date Taking? Authorizing Provider  amitriptyline (ELAVIL) 10 MG tablet Take 10 mg by mouth at bedtime.    [provider]  gabapentin (NEURONTIN) 100 MG  capsule Take 100 mg by mouth at bedtime.    [provider]  HYDROcodone-acetaminophen (NORCO/VICODIN) 5-325 MG tablet Take 1 tablet by mouth every 4 (four) hours as needed (pain). 06/26/19   Judith Part, MD  hydrOXYzine (ATARAX/VISTARIL) 50 MG tablet Take 50 mg by mouth 3 (three) times daily as needed for anxiety.     [provider]  hydrOXYzine (VISTARIL) 50 MG capsule Take by mouth. 11/08/16   [provider]  insulin degludec (TRESIBA FLEXTOUCH) 100 UNIT/ML SOPN FlexTouch Pen Inject into the skin. 04/06/19   [provider]  insulin lispro (HUMALOG KWIKPEN) 100 UNIT/ML KwikPen Inject 4-8 Units into the skin 3 (three) times daily as needed (blood sugar 150 or above).    [provider]  Liraglutide -Weight Management (SAXENDA) 18 MG/3ML SOPN Inject 3 mg into the skin daily.  02/17/19   [provider]  losartan (COZAAR) 25 MG tablet Take 25 mg by mouth daily. 04/13/19   [provider]  meclizine (ANTIVERT) 25 MG tablet Take 1 tablet (25 mg total) by mouth 3 (three) times daily as needed for dizziness. 06/26/19   Judith Part, MD  ondansetron (ZOFRAN) 4 MG tablet Take 1 tablet (4 mg total) by mouth every 8 (eight) hours as needed for nausea or vomiting. 06/26/19 06/25/20  Judith Part, MD  TRESIBA FLEXTOUCH 100 UNIT/ML SOPN FlexTouch Pen Inject 20 Units into the skin at bedtime.  11/18/18   [provider]  Past Surgical History Past Surgical History:  Procedure Laterality Date  . APPLICATION OF CRANIAL NAVIGATION N/A 06/23/2019   Procedure: APPLICATION OF CRANIAL NAVIGATION;  Surgeon: Judith Part, MD;  Location: [place];  Service: Neurosurgery;  Laterality: N/A;  . CRANIOTOMY Left 06/23/2019   Procedure: Left craniotomy for tumor resection with brainlab/facial nerve  monitoring;  Surgeon: Judith Part, MD;  Location: [facility];  Service: Neurosurgery;  Laterality: Left;  . THYROIDECTOMY, PARTIAL    . TUBAL LIGATION     1997  . tubal ligation reversal  2013   Family History History reviewed. No pertinent family history.  Social History Social History   Tobacco Use  . Smoking status: Never Smoker  . Smokeless tobacco: Never Used  Substance Use Topics  . Alcohol use: Not Currently  . Drug use: Never   Allergies Lisinopril  Review of Systems Review of Systems All other systems are reviewed and are negative for acute change except as noted in the HPI  Physical Exam Vital Signs  I have reviewed the triage vital signs BP 135/81 (BP Location: Left Arm)   Pulse 94   Temp 98 F (36.7 C) (Oral)   Resp 18   SpO2 100%   Physical Exam Vitals reviewed.  Constitutional:      General: She is not in acute distress.    Appearance: She is well-developed. She is not diaphoretic.  HENT:     Head: Normocephalic and atraumatic.     Right Ear: External ear normal.     Left Ear: External ear normal.     Nose: Nose normal.  Eyes:     General: No scleral icterus.    Extraocular Movements:     Left eye: Abnormal extraocular motion present.     Conjunctiva/sclera: Conjunctivae normal.     Comments: Left lateral extraocular movement deficit  Neck:     Trachea: Phonation normal.  Cardiovascular:     Rate and Rhythm: Normal rate and regular rhythm.  Pulmonary:     Effort: Pulmonary effort is normal. No respiratory distress.     Breath sounds: No stridor.  Abdominal:     General: There is no distension.  Musculoskeletal:        General: Normal range of motion.     Cervical back: Normal range of motion.  Neurological:     Mental Status: She is alert and oriented to person, place, and time.     Comments: Mental Status:  Alert and oriented to person, place, and time.  Attention and concentration normal.  Speech clear.  Recent memory is  intact  Cranial Nerves:  II Visual Fields: Intact to confrontation. Visual fields intact. III, IV, VI: Pupils equal and reactive to light and near.   V Facial Sensation: Normal. No weakness of masticatory muscles  VII: No facial weakness or asymmetry  VIII Auditory Acuity: Grossly normal  IX/X: The uvula is midline; the palate elevates symmetrically  XI: Normal sternocleidomastoid and trapezius strength  XII: The tongue is midline. No atrophy or fasciculations.   Motor System: Muscle Strength: 5/5 and symmetric in the upper and lower extremities. No pronation or drift.  Muscle Tone: Tone and muscle bulk are normal in the upper and lower extremities.   Reflexes: DTRs: 1+ and symmetrical in all four extremities. No Clonus Coordination: Intact finger-to-nose, heel-to-shin. No tremor.  Sensation: Intact to light touch..  Gait: Routine gait normal.   Psychiatric:        Behavior: Behavior normal.  ED Results and Treatments Labs (all labs ordered are listed, but only abnormal results are displayed) Labs Reviewed  I-STAT BETA HCG BLOOD, ED ([facility], [facility], [facility] ONLY)                                                                                                                         EKG  EKG Interpretation  Date/Time:    Ventricular Rate:    PR Interval:    QRS Duration:   QT Interval:    QTC Calculation:   R Axis:     Text Interpretation:        Radiology CT Head Wo Contrast  Result Date: 07/31/2019 CLINICAL DATA:  Initial evaluation for acute severe headache status post radiation therapy today. EXAM: CT HEAD WITHOUT CONTRAST TECHNIQUE: Contiguous axial images were obtained from the base of the skull through the vertex without intravenous contrast. COMPARISON:  Prior MRI from 07/24/2019. FINDINGS: Brain: Postoperative changes from prior left retrosigmoid craniectomy with cranioplasty again seen, stable. No acute intracranial hemorrhage. No acute large vessel territory infarct.  Residual mass adjacent to the left midbrain grossly stable from previous MRI, better seen on previous exam. No hydrocephalus. No extra-axial fluid collection. Vascular: No hyperdense vessel. Skull: Prior left retrosigmoid craniectomy with cranioplasty. 3.6 cm CSF collection about the cranioplasty mesh is stable from previous MRI. No acute scalp soft tissue abnormality. Sinuses/Orbits: Globes and orbital soft tissues within normal limits. Paranasal sinuses are clear. Right mastoid air cells clear. Postoperative left mastoid effusion stable from previous. Other: None. IMPRESSION: 1. No acute intracranial abnormality. 2. Stable postoperative changes from prior left retrosigmoid craniectomy with cranioplasty. Stable 3.6 cm CSF collection about the cranioplasty mesh. 3. Stable left mastoid effusion. Electronically Signed   By: Jeannine Boga M.D.   On: 07/31/2019 00:44    Pertinent labs & imaging results that were available during my care of the patient were reviewed by me and considered in my medical decision making (see chart for details).  Medications Ordered in ED Medications  diphenhydrAMINE (BENADRYL) injection 25 mg (25 mg Intravenous Given 07/31/19 0036)  dexamethasone (DECADRON) injection 10 mg (10 mg Intravenous Given 07/31/19 0036)  prochlorperazine (COMPAZINE) injection 10 mg (10 mg Intravenous Given 07/31/19 0036)  Procedures Procedures  (including critical care time)  Medical Decision Making / ED Course I have reviewed the nursing notes for this encounter and the patient's prior records (if available in EHR or on provided paperwork).   HERMONIE RASMUSSON was evaluated in Emergency Department on 07/31/2019 for the symptoms described in the history of present illness. She was evaluated in the context of the global COVID-19 pandemic, which necessitated  consideration that the patient might be at risk for infection with the SARS-CoV-2 virus that causes COVID-19. Institutional protocols and algorithms that pertain to the evaluation of patients at risk for COVID-19 are in a state of rapid change based on information released by regulatory bodies including the CDC and federal and state organizations. These policies and algorithms were followed during the patient's care in the ED.  New type HA s/p radiation for meningioma near left CNV. No acute neuro deficits per patient since radiation. Gradual onset - doubt SAH. No fever or meningeal sx - doubt infectious meningitis  Will get CT head to assess for post radiation edema vs ICH. CT head w/o ICH or acute changes/  Migraine cocktail provided.  Significant improvement.   The patient appears reasonably screened and/or stabilized for discharge and I doubt any other medical condition or other [facility] requiring further screening, evaluation, or treatment in the ED at this time prior to discharge.  The patient is safe for discharge with strict return precautions.       Final Clinical Impression(s) / ED Diagnoses Final diagnoses:  Bad headache    The patient appears reasonably screened and/or stabilized for discharge and I doubt any other medical condition or other [facility] requiring further screening, evaluation, or treatment in the ED at this time prior to discharge.  Disposition: Discharge  Condition: Good  I have discussed the results, Dx and Tx plan with the patient who expressed understanding and agree(s) with the plan. Discharge instructions discussed at great length. The patient was given strict return precautions who verbalized understanding of the instructions. No further questions at time of discharge.    ED Discharge Orders    None        Follow Up: Kyung Rudd, MD [address] [place] [address] [phone]  Call  As needed      This chart was dictated using voice  recognition software.  Despite best efforts to proofread,  errors can occur which can change the documentation meaning.   Fatima Blank, MD 07/31/19 [phone]

## 2019-07-30 NOTE — Progress Notes (Signed)
  Radiation Oncology         [phone] ________________________________  Name: Tricia Soto MRN: [id number]  Date: 07/24/2019  DOB: Mar 11, 1976  DIAGNOSIS:     ICD-10-CM   1. Benign tumor of cerebral meninges ([place])  D32.0     NARRATIVE:  The patient was brought to the [facility].  Identity was confirmed.  All relevant records and images related to the planned course of therapy were reviewed.  The patient freely provided informed written consent to proceed with treatment after reviewing the details related to the planned course of therapy. The consent form was witnessed and verified by the simulation staff. Intravenous access was established for contrast administration. Then, the patient was set-up in a stable reproducible supine position for radiation therapy.  A relocatable thermoplastic stereotactic head frame was fabricated for precise immobilization.  CT images were obtained.  Surface markings were placed.  The CT images were loaded into the planning software and fused with the patient's targeting MRI scan.  Then the target and avoidance structures were contoured.  Treatment planning then occurred.  The radiation prescription was entered and confirmed.  I have requested 3D planning  I have requested a DVH of the following structures: Brain stem, brain, left eye, right eye, lenses, optic chiasm, target volumes, uninvolved brain, and normal tissue.    SPECIAL TREATMENT PROCEDURE:  The planned course of therapy using radiation constitutes a special treatment procedure. Special care is required in the management of this patient for the following reasons. This treatment constitutes a Special Treatment Procedure for the following reason: High dose per fraction requiring special monitoring for increased toxicities of treatment including daily imaging.  The special nature of the planned course of radiotherapy will require increased physician supervision and oversight to ensure  patient's safety with optimal treatment outcomes.  PLAN:  The patient will receive 14 Gy in 1 fraction.   ------------------------------------------------  Jodelle Gross, MD, PhD

## 2019-07-31 LAB — I-STAT BETA HCG BLOOD, ED (MC, WL, AP ONLY): I-stat hCG, quantitative: <5 m[IU]/mL (ref <5)

## 2019-07-31 NOTE — Discharge Instructions (Addendum)

## 2019-08-03 ENCOUNTER — Other Ambulatory Visit: Payer: Self-pay

## 2019-08-03 ENCOUNTER — Inpatient Hospital Stay: Payer: Managed Care, Other (non HMO) | Attending: Internal Medicine | Admitting: Internal Medicine

## 2019-08-03 VITALS — BP 154/105 | HR 87 | Temp 98.8°F | Resp 17 | Ht 68.0 in | Wt 224.9 lb

## 2019-08-03 DIAGNOSIS — Z794 Long term (current) use of insulin: Secondary | ICD-10-CM | POA: Diagnosis not present

## 2019-08-03 DIAGNOSIS — I10 Essential (primary) hypertension: Secondary | ICD-10-CM

## 2019-08-03 DIAGNOSIS — Z79899 Other long term (current) drug therapy: Secondary | ICD-10-CM | POA: Diagnosis not present

## 2019-08-03 DIAGNOSIS — D329 Benign neoplasm of meninges, unspecified: Secondary | ICD-10-CM | POA: Diagnosis present

## 2019-08-03 DIAGNOSIS — H532 Diplopia: Secondary | ICD-10-CM

## 2019-08-03 DIAGNOSIS — E119 Type 2 diabetes mellitus without complications: Secondary | ICD-10-CM | POA: Insufficient documentation

## 2019-08-04 ENCOUNTER — Telehealth: Payer: Self-pay | Admitting: Internal Medicine

## 2019-08-04 ENCOUNTER — Telehealth: Payer: Self-pay | Admitting: *Deleted

## 2019-08-04 NOTE — Telephone Encounter (Signed)
Referral to [facility]hthal[facility] was sent on 08/04/2019 to see Dr. Hassell Done via fax [phone].  First available appt April 2, @ 7:40 (patient to arrive 30 minutes prior) [employer]. Records faxed with request to work in early if possible. Patient can call and inquiry with [facility] scheduler [phone]  Referral also sent to [facility] to be seen by their Neuro Opthalmology team via fax [phone].  No appt created at this time with them. Phone follow up can be done at [phone].  Attempted to make patient aware.  Left message for returned call to explain. Pending call back.

## 2019-08-04 NOTE — Progress Notes (Signed)
[facility] at [place], [place] [address] [phone]   New Patient Evaluation  Date of Service: 08/04/19 Patient Name: Tricia Soto Patient MRN: [id number] Patient DOB: Sep 27, 1975 Provider: Ventura Sellers, MD  Identifying Statement:  Tricia Soto is a 43 y.o. 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    Referring Provider: Timmie Foerster, [place],  [place] [address]  Oncologic History: 06/23/19: Craniotomy, debulking resection of petroclival meningioma by Dr. Zada Finders 07/30/19: SRS to residual tumor Lisbeth Renshaw)  History of Present Illness: The patient's records from the referring physician were obtained and reviewed and the patient interviewed to confirm this HPI.  Tricia Soto presents today for follow up after having completed radiation therapy last week.  She initially began experiencing left sided head/facial pain episodes in September 2020, different in nature from her established migraine headache syndrome.  After no relief from traditional pain medications, she obtained as CT (and later MRI) which demonstrated mass abutting the clivus and brainstem.  She went for resection with Dr. Zada Finders on 06/23/19, after which she began to experience double vision.  Double vision, which is persistent to today, is described as "seeing objects side by side when looking straight ahead, worse to the left".  In addition she has experienced some facial numbness, although this is not dense or consistent.  Finally, she feels her left ear hearing was poor following surgery, but this has improved considerably, nearly to baseline.  No issues with radiation.  Currently she is concerned about her ability to drive and return to work given her visual impairments.  Medications: Current Outpatient Medications on File Prior to Visit  Medication Sig Dispense Refill  . amitriptyline  (ELAVIL) 10 MG tablet Take 10 mg by mouth at bedtime.    . butalbital-acetaminophen-caffeine (FIORICET) 50-325-40 MG tablet Take 1 tablet by mouth every 6 (six) hours as needed. For migraine    . gabapentin (NEURONTIN) 100 MG capsule Take 100 mg by mouth at bedtime.    Marland Kitchen HYDROcodone-acetaminophen (NORCO/VICODIN) 5-325 MG tablet Take 1 tablet by mouth every 4 (four) hours as needed (pain). 30 tablet 0  . insulin lispro (HUMALOG KWIKPEN) 100 UNIT/ML KwikPen Inject 4-8 Units into the skin 3 (three) times daily as needed (blood sugar 150 or above).    . Liraglutide -Weight Management (SAXENDA) 18 MG/3ML SOPN Inject 3 mg into the skin daily.     Marland Kitchen losartan (COZAAR) 25 MG tablet Take 25 mg by mouth daily.    . meclizine (ANTIVERT) 25 MG tablet Take 1 tablet (25 mg total) by mouth 3 (three) times daily as needed for dizziness. 30 tablet 0  . ondansetron (ZOFRAN) 4 MG tablet Take 1 tablet (4 mg total) by mouth every 8 (eight) hours as needed for nausea or vomiting. 30 tablet 1  . TRESIBA FLEXTOUCH 100 UNIT/ML SOPN FlexTouch Pen Inject 20 Units into the skin at bedtime.     . hydrOXYzine (ATARAX/VISTARIL) 50 MG tablet Take 50 mg by mouth 3 (three) times daily as needed for anxiety.      No current facility-administered medications on file prior to visit.    Allergies:  Allergies  Allergen Reactions  . Lisinopril Cough and Other (See Comments)   Past Medical History:  Past Medical History:  Diagnosis Date  . Chronic headaches   . Diabetes mellitus without complication ([place])   . GERD (gastroesophageal reflux disease)   .  Hypertension   . Meningioma [facility])    Past Surgical History:  Past Surgical History:  Procedure Laterality Date  . APPLICATION OF CRANIAL NAVIGATION N/A 06/23/2019   Procedure: APPLICATION OF CRANIAL NAVIGATION;  Surgeon: Judith Part, MD;  Location: [name];  Service: Neurosurgery;  Laterality: N/A;  . CRANIOTOMY Left 06/23/2019   Procedure: Left craniotomy for tumor  resection with brainlab/facial nerve monitoring;  Surgeon: Judith Part, MD;  Location: [facility];  Service: Neurosurgery;  Laterality: Left;  . THYROIDECTOMY, PARTIAL    . TUBAL LIGATION     1997  . tubal ligation reversal  2013   Social History:  Social History   Socioeconomic History  . Marital status: Married    Spouse name: Not on file  . Number of children: Not on file  . Years of education: Not on file  . Highest education level: Not on file  Occupational History  . Not on file  Tobacco Use  . Smoking status: Never Smoker  . Smokeless tobacco: Never Used  Substance and Sexual Activity  . Alcohol use: Not Currently  . Drug use: Never  . Sexual activity: Not on file  Other Topics Concern  . Not on file  Social History Narrative  . Not on file   Social Determinants of Health   Financial Resource Strain:   . Difficulty of Paying Living Expenses: Not on file  Food Insecurity:   . Worried About Charity fundraiser in the Last Year: Not on file  . Ran Out of Food in the Last Year: Not on file  Transportation Needs:   . Lack of Transportation (Medical): Not on file  . Lack of Transportation (Non-Medical): Not on file  Physical Activity:   . Days of Exercise per Week: Not on file  . Minutes of Exercise per Session: Not on file  Stress:   . Feeling of Stress : Not on file  Social Connections:   . Frequency of Communication with Friends and Family: Not on file  . Frequency of Social Gatherings with Friends and Family: Not on file  . Attends Religious Services: Not on file  . Active Member of Clubs or Organizations: Not on file  . Attends Archivist Meetings: Not on file  . Marital Status: Not on file  Intimate Partner Violence:   . Fear of Current or Ex-Partner: Not on file  . Emotionally Abused: Not on file  . Physically Abused: Not on file  . Sexually Abused: Not on file   Family History: No family history on file.  Review of  Systems: Constitutional: Denies fevers, chills or abnormal weight loss Eyes: Denies blurriness of vision Ears, nose, mouth, throat, and face: Denies mucositis or sore throat Respiratory: Denies cough, dyspnea or wheezes Cardiovascular: Denies palpitation, chest discomfort or lower extremity swelling Gastrointestinal:  Denies nausea, constipation, diarrhea GU: Denies dysuria or incontinence Skin: Denies abnormal skin rashes Neurological: Per HPI Musculoskeletal: Denies joint pain, back or neck discomfort. No decrease in ROM Behavioral/Psych: Denies anxiety, disturbance in thought content, and mood instability  Physical Exam: Vitals:   08/03/19 0929  BP: (!) 154/105  Pulse: 87  Resp: 17  Temp: 98.8 F (37.1 C)  SpO2: 100%   KPS: 80. General: Alert, cooperative, pleasant, in no acute distress Head: Normal EENT: No conjunctival injection or scleral icterus. Oral mucosa moist Lungs: Resp effort normal Cardiac: Regular rate and rhythm Abdomen:  non-distended abdomen Skin: No rashes cyanosis or petechiae. Extremities: No clubbing or  edema  Neurologic Exam: Mental Status: Awake, alert, attentive to examiner. Oriented to self and environment. Language is fluent with intact comprehension.  Cranial Nerves: Visual acuity is grossly normal. Visual fields are full. Noted "dense" CN VI paresis on left. No ptosis. Face is symmetric. Motor: Tone and bulk are normal. Power is full in both arms and legs. Reflexes are symmetric, no pathologic reflexes present. Sensory: Intact to light touch and temperature Gait: Normal   Labs: I have reviewed the data as listed    Component Value Date/Time   NA 141 06/23/2019 1145   K 3.9 06/23/2019 1145   CL 109 06/19/2019 1001   CO2 25 06/19/2019 1001   GLUCOSE 130 (H) 06/19/2019 1001   BUN 7 06/19/2019 1001   CREATININE 0.83 06/23/2019 1536   CALCIUM 8.6 (L) 06/19/2019 1001   GFRNONAA >60 06/23/2019 1536   GFRAA >60 06/23/2019 1536   Lab  Results  Component Value Date   WBC 15.4 (H) 06/23/2019   NEUTROABS 5.2 04/22/2019   HGB 10.3 (L) 06/23/2019   HCT 31.2 (L) 06/23/2019   MCV 86.0 06/23/2019   PLT 233 06/23/2019     CT Head Wo Contrast  Result Date: 07/31/2019 CLINICAL DATA:  Initial evaluation for acute severe headache status post radiation therapy today. EXAM: CT HEAD WITHOUT CONTRAST TECHNIQUE: Contiguous axial images were obtained from the base of the skull through the vertex without intravenous contrast. COMPARISON:  Prior MRI from 07/24/2019. FINDINGS: Brain: Postoperative changes from prior left retrosigmoid craniectomy with cranioplasty again seen, stable. No acute intracranial hemorrhage. No acute large vessel territory infarct. Residual mass adjacent to the left midbrain grossly stable from previous MRI, better seen on previous exam. No hydrocephalus. No extra-axial fluid collection. Vascular: No hyperdense vessel. Skull: Prior left retrosigmoid craniectomy with cranioplasty. 3.6 cm CSF collection about the cranioplasty mesh is stable from previous MRI. No acute scalp soft tissue abnormality. Sinuses/Orbits: Globes and orbital soft tissues within normal limits. Paranasal sinuses are clear. Right mastoid air cells clear. Postoperative left mastoid effusion stable from previous. Other: None. IMPRESSION: 1. No acute intracranial abnormality. 2. Stable postoperative changes from prior left retrosigmoid craniectomy with cranioplasty. Stable 3.6 cm CSF collection about the cranioplasty mesh. 3. Stable left mastoid effusion. Electronically Signed   By: Jeannine Boga M.D.   On: 07/31/2019 00:44   MR Brain W Wo Contrast  Result Date: 07/25/2019 CLINICAL DATA:  Meningioma status post surgery 06/23/2019. Planning for stereotactic radio surgery. EXAM: MRI HEAD WITHOUT AND WITH CONTRAST TECHNIQUE: Multiplanar, multiecho pulse sequences of the brain and surrounding structures were obtained without and with intravenous contrast.  CONTRAST:  36mL MULTIHANCE GADOBENATE DIMEGLUMINE 529 MG/ML IV SOLN COMPARISON:  Brain MRI 06/24/2019 FINDINGS: BRAIN: There is no acute infarct, acute hemorrhage or extra-axial collection. Status post left retromastoid approach meningioma resection. Hyperintense T2-weighted signal along the left aspect of the brainstem is unchanged. The previously seen signal abnormalities in the anterior left cerebellar hemisphere have resolved. Supratentorial brain is normal. Chronic hemorrhagic focus in the left pons is unchanged. The cerebral and cerebellar volume are age-appropriate. There is no hydrocephalus. Small amount of contrast enhancement within the left pons, new since the prior study. Residual mass at the left anterior aspect of the midbrain measures 1.2 x 0.9 x 2.2 cm. VASCULAR: The major intracranial arterial and venous sinus flow voids are normal. SKULL AND UPPER CERVICAL SPINE: Fluid throughout the left mastoid and petrous apex. Small amount of fluid overlying the left retromastoid craniotomy site. SINUSES/ORBITS:  There are no fluid levels or advanced mucosal thickening. The orbits are normal. IMPRESSION: 1. Unchanged residual meningioma measuring 1.2 x 0.9 x 2.2 cm anterolateral to the upper brain stem. 2. New small amount of contrast enhancement within the left pons, likely post-ischemic enhancement. 3. Resolution of previously seen signal abnormality in the anterior left cerebellar hemisphere. 4. Fluid throughout the left mastoid and petrous apex. Electronically Signed   By: Ulyses Jarred M.D.   On: 07/25/2019 00:26    Pathology: SURGICAL PATHOLOGY  CASE: [id number]  PATIENT: Tricia Soto  Surgical Pathology Report   Clinical History: brain tumor (cm)   FINAL MICROSCOPIC DIAGNOSIS:   A. BRAIN, LEFT POSTERIOR FOSSA, RESECTION:  - Consistent with meningioma, WHO grade I.  - See comment.   COMMENT:   - The tumor cells are positive with vimentin, EMA, and progesterone  receptor. They are  negative for inhibin, NSE, GFAP, and S100. This  profile supports the above diagnosis. Additional studies can be  performed upon clinician request.    Assessment/Plan Meningioma ([place]) [D32.9]  We appreciate the opportunity to participate in the care of Tricia Soto.  She is clinically stable following radiation therapy.  For disabling opthalmoplegia, we recommended evaluation by neuro-ophthalmology.  Will place referral for consultation with Dr. Jolyn Nap at [facility].  Headaches may be treated with over the counter analgesia (Tylenol or Ibuprofen) for now, unless they become more severe in nature requiring clinical re-evaluation.  We are ok with her discontinuing low doses of Elavil and Gabapentin if desired.  Screening for potential clinical trials was performed and discussed using eligibility criteria for active protocols at [facility], loco-regional tertiary centers, as well as national database available on [email or web address].    The patient is not a candidate for a research protocol at this time due to no suitable study identified.   We spent twenty additional minutes teaching regarding the natural history, biology, and historical experience in the treatment of brain tumors. We then discussed in detail the current recommendations for therapy focusing on the mode of administration, mechanism of action, anticipated toxicities, and quality of life issues associated with this plan.   She should return to clinic in 3 months with an MRI brain for evaluation.  All questions were answered. The patient knows to call the clinic with any problems, questions or concerns. No barriers to learning were detected.  The total time spent in the encounter was 45 minutes and more than 50% was on counseling and review of test results   Ventura Sellers, MD Medical Director of Neuro-Oncology [facility] at [place] 08/04/19 9:09 AM

## 2019-08-04 NOTE — Telephone Encounter (Signed)
Patient was able to get an appointment [facility] for September 04, 2019 with Dr. Newman Pies.  Appt at [facility] canceled.

## 2019-08-04 NOTE — Telephone Encounter (Signed)
Per 12/21 los, appt already scheduled.

## 2019-08-11 ENCOUNTER — Telehealth: Payer: Self-pay | Admitting: *Deleted

## 2019-08-11 NOTE — Telephone Encounter (Signed)
Patient called to request a letter to be created so that she could provide to her employer specifying that she is unable to return to work as previously anticipate of January 4th.    Letter prepared and mailed to patient so that she can submit to her employer.

## 2019-08-13 NOTE — Progress Notes (Signed)
  Name: Tricia Soto  MRN: [id number]  Date: 07/30/2019   DOB: [birth year]  Stereotactic Radiosurgery Operative Note  PRE-OPERATIVE DIAGNOSIS:  Skull base meningioma  POST-OPERATIVE DIAGNOSIS:  Skull base meningioma  PROCEDURE:  Stereotactic Radiosurgery  SURGEON:  Judith Part, MD  NARRATIVE: The patient underwent a radiation treatment planning session in the radiation oncology simulation suite under the care of the radiation oncology physician and physicist.  I participated closely in the radiation treatment planning afterwards. The patient underwent planning CT which was fused to 3T high resolution MRI with 1 mm axial slices.  These images were fused on the planning system.  We contoured the gross target volumes and subsequently expanded this to yield the Planning Target Volume. I actively participated in the planning process.  I helped to define and review the target contours and also the contours of the optic pathway, eyes, brainstem and selected nearby organs at risk.  All the dose constraints for critical structures were reviewed and compared to AAPM Task Group 101.  The prescription dose conformity was reviewed.  I approved the plan electronically.    Accordingly, Tricia Soto was brought to the TrueBeam stereotactic radiation treatment linac and placed in the custom immobilization mask.  The patient was aligned according to the IR fiducial markers with BrainLab Exactrac, then orthogonal x-rays were used in ExacTrac with the 6DOF robotic table and the shifts were made to align the patient  Tricia Soto received stereotactic radiosurgery uneventfully.    Lesions treated:  1   Complex lesions treated:  0 (>3.5 cm, <10mm of optic path, or within the brainstem)   The detailed description of the procedure is recorded in the radiation oncology procedure note.  I was present for the duration of the procedure.  DISPOSITION:  Following delivery, the patient was transported to nursing  in stable condition and monitored for possible acute effects to be discharged to home in stable condition with follow-up in one month.  Judith Part, MD 08/13/2019 11:35 AM

## 2019-08-13 NOTE — Addendum Note (Signed)
Encounter addended by: Judith Part, MD on: 08/13/2019 11:44 AM  Actions taken: Clinical Note Signed

## 2019-08-31 ENCOUNTER — Other Ambulatory Visit: Payer: Self-pay

## 2019-08-31 ENCOUNTER — Ambulatory Visit
Admission: RE | Admit: 2019-08-31 | Discharge: 2019-08-31 | Disposition: A | Discharge status: Home / Self Care | Payer: Managed Care, Other (non HMO) | Source: Ambulatory Visit | Attending: Radiation Oncology | Admitting: Radiation Oncology

## 2019-08-31 ENCOUNTER — Encounter: Payer: Self-pay | Admitting: Radiation Oncology

## 2019-08-31 DIAGNOSIS — D320 Benign neoplasm of cerebral meninges: Secondary | ICD-10-CM

## 2019-08-31 NOTE — Progress Notes (Signed)
  Radiation Oncology         [phone] ________________________________  Name: Tricia Soto MRN: [id number]  Date of Service: 08/31/2019  DOB: 1976/05/26  Post Treatment Telephone Note  Diagnosis:   Grade 1 meningioma with residual enhancement following resection  Interval Since Last Radiation: 5 weeks   07/30/2019 SRS Treatment.  PTV1 Ant Mid Brain 1mm  Narrative:  The patient was contacted today for routine follow-up. During treatment she did very well with radiotherapy and did not have significant desquamation.  Impression/Plan: 1. Grade 1 meningioma with residual enhancement following resection. The patient appears been doing well since completion of radiotherapy when reviewing her notes from Dr. Renda Rolls visit. I had to leave a message today when I called her.  I discussed that we would be happy to continue to follow her as needed, but she will also continue to follow up with Dr. Mickeal Skinner in neuro oncology. I let her know on the message we would also be in attendance when her case is discussed in brain oncology conference.     Carola Rhine, PAC

## 2019-09-28 DIAGNOSIS — I10 Essential (primary) hypertension: Secondary | ICD-10-CM | POA: Diagnosis not present

## 2019-10-28 ENCOUNTER — Other Ambulatory Visit: Payer: Managed Care, Other (non HMO)

## 2019-10-29 DIAGNOSIS — D496 Neoplasm of unspecified behavior of brain: Secondary | ICD-10-CM | POA: Diagnosis not present

## 2019-10-30 DIAGNOSIS — E782 Mixed hyperlipidemia: Secondary | ICD-10-CM | POA: Diagnosis not present

## 2019-10-30 DIAGNOSIS — I10 Essential (primary) hypertension: Secondary | ICD-10-CM | POA: Diagnosis not present

## 2019-11-02 ENCOUNTER — Ambulatory Visit: Payer: Managed Care, Other (non HMO) | Admitting: Internal Medicine

## 2019-11-03 DIAGNOSIS — H4922 Sixth [abducent] nerve palsy, left eye: Secondary | ICD-10-CM | POA: Diagnosis not present

## 2019-11-03 DIAGNOSIS — H532 Diplopia: Secondary | ICD-10-CM | POA: Diagnosis not present

## 2019-11-04 ENCOUNTER — Other Ambulatory Visit: Payer: Self-pay

## 2019-11-04 ENCOUNTER — Ambulatory Visit
Admission: RE | Admit: 2019-11-04 | Discharge: 2019-11-04 | Disposition: A | Discharge status: Home / Self Care | Payer: BC Managed Care – PPO | Source: Ambulatory Visit | Attending: Radiation Oncology | Admitting: Radiation Oncology

## 2019-11-04 DIAGNOSIS — D329 Benign neoplasm of meninges, unspecified: Secondary | ICD-10-CM | POA: Diagnosis not present

## 2019-11-04 MED ORDER — GADOBENATE DIMEGLUMINE 529 MG/ML IV SOLN
20.0000 mL | Freq: Once | INTRAVENOUS | Status: AC | PRN
  Administered 2019-11-04: 20 mL via INTRAVENOUS

## 2019-11-05 ENCOUNTER — Other Ambulatory Visit: Payer: Self-pay

## 2019-11-05 ENCOUNTER — Telehealth: Payer: Self-pay

## 2019-11-05 ENCOUNTER — Other Ambulatory Visit: Payer: Self-pay | Admitting: Internal Medicine

## 2019-11-05 MED ORDER — DEXAMETHASONE 4 MG PO TABS: 4.0000 mg | ORAL_TABLET | Freq: Every day | ORAL | 0 refills | Status: DC

## 2019-11-05 NOTE — Telephone Encounter (Signed)
Called and informed patient of prescription being called into her pharmacy.  Informed patient to call office if headaches become worse. Patient verbalized understanding.

## 2019-11-05 NOTE — Telephone Encounter (Signed)
-----   Message from Ventura Sellers, MD sent at 11/05/2019 10:34 AM EDT ----- Regarding: RE: Patient question Dexamethasone 4mg  daily x1 week, I will call in script ----- Message ----- From: Teodoro Spray, RN Sent: 11/05/2019   9:25 AM EDT To: Ventura Sellers, MD Subject: Patient question                               Patient called office stating she has had a constant, dull headache x 1 week, rated 7/10 for pain.  Patient states she has tried many OTC meds (aspirin, excedrine, motrin) and nothing has been giving her relief.  She last saw you on 08/03/19 for these headaches.  Patient is scheduled for follow-up with you on 11/12/19 but wants to know if there is something else she can take now for headache relief.   Please advise.  Thank you

## 2019-11-11 ENCOUNTER — Telehealth: Payer: Self-pay | Admitting: *Deleted

## 2019-11-11 NOTE — Telephone Encounter (Signed)
Called in reference to second FMLA leave of absence form received on 11/02/2019. Initial request received 10/27/2019, completed and faxed.   Per Collene [facility] [employer], leave request: [phone] was received 11/06/2019.  Awaiting decision which takes five business days.  Client will receive communication from [place] [place] with decision.

## 2019-11-12 ENCOUNTER — Other Ambulatory Visit: Payer: Self-pay

## 2019-11-12 ENCOUNTER — Inpatient Hospital Stay: Payer: BC Managed Care – PPO | Attending: Internal Medicine | Admitting: Internal Medicine

## 2019-11-12 VITALS — BP 129/91 | HR 89 | Temp 98.7°F | Resp 18 | Ht 68.0 in | Wt 235.8 lb

## 2019-11-12 DIAGNOSIS — R519 Headache, unspecified: Secondary | ICD-10-CM | POA: Insufficient documentation

## 2019-11-12 DIAGNOSIS — I10 Essential (primary) hypertension: Secondary | ICD-10-CM | POA: Insufficient documentation

## 2019-11-12 DIAGNOSIS — G4489 Other headache syndrome: Secondary | ICD-10-CM | POA: Diagnosis not present

## 2019-11-12 DIAGNOSIS — Z79899 Other long term (current) drug therapy: Secondary | ICD-10-CM | POA: Insufficient documentation

## 2019-11-12 DIAGNOSIS — H4930 Total (external) ophthalmoplegia, unspecified eye: Secondary | ICD-10-CM

## 2019-11-12 DIAGNOSIS — D329 Benign neoplasm of meninges, unspecified: Secondary | ICD-10-CM | POA: Diagnosis not present

## 2019-11-12 DIAGNOSIS — Z923 Personal history of irradiation: Secondary | ICD-10-CM | POA: Insufficient documentation

## 2019-11-12 DIAGNOSIS — E119 Type 2 diabetes mellitus without complications: Secondary | ICD-10-CM | POA: Insufficient documentation

## 2019-11-12 DIAGNOSIS — Z794 Long term (current) use of insulin: Secondary | ICD-10-CM | POA: Diagnosis not present

## 2019-11-12 NOTE — Progress Notes (Signed)
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 Zada Finders 07/30/19: SRS to residual tumor [facility])  Interval History:  Tricia Soto presents today for follow up after recent MRI brain.  She describes some improvement in double vision since obtaining prism glasses through neuro-ophthalmologist at [facility].  Headaches have been more severe over past few weeks.  They have been localized in her upper neck radiating to the back of the head, which is different from her typical migraine syndrome.  Occasionally there are tension type headaches as well.  Has been dosing tylenol, ibuprofen and fioricet less than daily.  H+P (08/03/19) Patient presents today for follow up after having completed radiation therapy last week.  She initially began experiencing left sided head/facial pain episodes in September 2020, different in nature from her established migraine headache syndrome.  After no relief from traditional pain medications, she obtained as CT (and later MRI) which demonstrated mass abutting the clivus and brainstem.  She went for resection with Dr. Zada Finders on 06/23/19, after which she began to experience double vision.  Double vision, which is persistent to today, is described as "seeing objects side by side when looking straight ahead, worse to the left".  In addition she has experienced some facial numbness, although this is not dense or consistent.  Finally, she feels her left ear hearing was poor following surgery, but this has improved considerably, nearly to baseline.  No issues with radiation.   Currently she is concerned about her ability to drive and return to work given her visual impairments.  Medications: Current Outpatient Medications on File Prior to Visit  Medication Sig Dispense Refill  . amitriptyline (ELAVIL) 10 MG tablet Take 10 mg by mouth at bedtime.    . butalbital-acetaminophen-caffeine (FIORICET) 50-325-40 MG tablet Take 1 tablet by mouth every 6 (six) hours as needed. For migraine    . dexamethasone (DECADRON) 4 MG tablet Take 1 tablet (4 mg total) by mouth daily. 7 tablet 0  . gabapentin (NEURONTIN) 100 MG capsule Take 100 mg by mouth at bedtime.    Marland Kitchen HYDROcodone-acetaminophen (NORCO/VICODIN) 5-325 MG tablet Take 1 tablet by mouth every 4 (four) hours as needed (pain). 30 tablet 0  . hydrOXYzine (ATARAX/VISTARIL) 50 MG tablet Take 50 mg by mouth 3 (three) times daily as needed for anxiety.     . insulin lispro (HUMALOG KWIKPEN) 100 UNIT/ML KwikPen Inject 4-8 Units into the skin 3 (three) times daily as needed (blood sugar 150 or above).    . Liraglutide -Weight Management (SAXENDA) 18 MG/3ML SOPN Inject 3 mg into the skin daily.     Marland Kitchen losartan (COZAAR) 25 MG tablet Take 25 mg by mouth daily.    . meclizine (ANTIVERT) 25 MG tablet Take 1 tablet (25 mg total) by mouth 3 (three) times daily as needed for dizziness. 30 tablet 0  . NOVOLOG FLEXPEN 100 UNIT/ML FlexPen     . ondansetron (ZOFRAN) 4 MG tablet Take 1 tablet (4 mg total) by mouth every 8 (eight) hours as needed for nausea or vomiting. 30 tablet 1  . TRESIBA FLEXTOUCH 100 UNIT/ML SOPN FlexTouch Pen Inject 20 Units into the  skin at bedtime.      No current facility-administered medications on file prior to visit.    Allergies:  Allergies  Allergen Reactions  . Lisinopril Cough and Other (See Comments)   Past Medical History:  Past Medical History:  Diagnosis Date  . Chronic headaches   . Diabetes mellitus without complication ([place])   . GERD (gastroesophageal reflux disease)   . Hypertension   .  Meningioma [facility])    Past Surgical History:  Past Surgical History:  Procedure Laterality Date  . APPLICATION OF CRANIAL NAVIGATION N/A 06/23/2019   Procedure: APPLICATION OF CRANIAL NAVIGATION;  Surgeon: Judith Part, MD;  Location: [facility];  Service: Neurosurgery;  Laterality: N/A;  . CRANIOTOMY Left 06/23/2019   Procedure: Left craniotomy for tumor resection with brainlab/facial nerve monitoring;  Surgeon: Judith Part, MD;  Location: [facility];  Service: Neurosurgery;  Laterality: Left;  . THYROIDECTOMY, PARTIAL    . TUBAL LIGATION     1997  . tubal ligation reversal  2013   Social History:  Social History   Socioeconomic History  . Marital status: Married    Spouse name: Not on file  . Number of children: Not on file  . Years of education: Not on file  . Highest education level: Not on file  Occupational History  . Not on file  Tobacco Use  . Smoking status: Never Smoker  . Smokeless tobacco: Never Used  Substance and Sexual Activity  . Alcohol use: Not Currently  . Drug use: Never  . Sexual activity: Not on file  Other Topics Concern  . Not on file  Social History Narrative  . Not on file   Social Determinants of Health   Financial Resource Strain:   . Difficulty of Paying Living Expenses:   Food Insecurity:   . Worried About Charity fundraiser in the Last Year:   . Arboriculturist in the Last Year:   Transportation Needs:   . Film/video editor (Medical):   Marland Kitchen Lack of Transportation (Non-Medical):   Physical Activity:   . Days of Exercise per Week:   . Minutes of Exercise per Session:   Stress:   . Feeling of Stress :   Social Connections:   . Frequency of Communication with Friends and Family:   . Frequency of Social Gatherings with Friends and Family:   . Attends Religious Services:   . Active Member of Clubs or Organizations:   . Attends Archivist Meetings:   Marland Kitchen Marital Status:   Intimate Partner Violence:   . Fear of Current  or Ex-Partner:   . Emotionally Abused:   Marland Kitchen Physically Abused:   . Sexually Abused:    Family History: No family history on file.  Review of Systems: Constitutional: Denies fevers, chills or abnormal weight loss Eyes: Denies blurriness of vision Ears, nose, mouth, throat, and face: Denies mucositis or sore throat Respiratory: Denies cough, dyspnea or wheezes Cardiovascular: Denies palpitation, chest discomfort or lower extremity swelling Gastrointestinal:  Denies nausea, constipation, diarrhea GU: Denies dysuria or incontinence Skin: Denies abnormal skin rashes Neurological: Per HPI Musculoskeletal: Denies joint pain, back or neck discomfort. No decrease in ROM Behavioral/Psych: Denies anxiety, disturbance in thought content, and mood instability  Physical Exam: Vitals:   11/12/19 1213  BP: (!) 129/91  Pulse: 89  Resp: 18  Temp: 98.7 F (37.1 C)  SpO2: 100%   KPS: 80. General: Alert, cooperative, pleasant, in no acute distress Head: Normal EENT:  No conjunctival injection or scleral icterus. Oral mucosa moist Lungs: Resp effort normal Cardiac: Regular rate and rhythm Abdomen:  non-distended abdomen Skin: No rashes cyanosis or petechiae. Extremities: No clubbing or edema  Neurologic Exam: Mental Status: Awake, alert, attentive to examiner. Oriented to self and environment. Language is fluent with intact comprehension.  Cranial Nerves: Visual acuity is grossly normal. Visual fields are full. Noted "dense" CN VI paresis on left. No ptosis. Face is symmetric. Motor: Tone and bulk are normal. Power is full in both arms and legs. Reflexes are symmetric, no pathologic reflexes present. Sensory: Intact to light touch and temperature Gait: Normal   Labs: I have reviewed the data as listed    Component Value Date/Time   NA 141 06/23/2019 1145   K 3.9 06/23/2019 1145   CL 109 06/19/2019 1001   CO2 25 06/19/2019 1001   GLUCOSE 130 (H) 06/19/2019 1001   BUN 7 06/19/2019 1001    CREATININE 0.83 06/23/2019 1536   CALCIUM 8.6 (L) 06/19/2019 1001   GFRNONAA >60 06/23/2019 1536   GFRAA >60 06/23/2019 1536   Lab Results  Component Value Date   WBC 15.4 (H) 06/23/2019   NEUTROABS 5.2 04/22/2019   HGB 10.3 (L) 06/23/2019   HCT 31.2 (L) 06/23/2019   MCV 86.0 06/23/2019   PLT 233 06/23/2019     MR Brain W Wo Contrast  Result Date: 11/04/2019 CLINICAL DATA:  Meningioma post surgery and radiation, follow-up EXAM: MRI HEAD WITHOUT AND WITH CONTRAST TECHNIQUE: Multiplanar, multiecho pulse sequences of the brain and surrounding structures were obtained without and with intravenous contrast. CONTRAST:  42mL MULTIHANCE GADOBENATE DIMEGLUMINE 529 MG/ML IV SOLN COMPARISON:  07/24/2019 FINDINGS: Brain: Residual meningioma along the left tentorial insertion within the left perimesencephalic and prepontine cistern measures similar in size on representative axial image 67 of series 11 at 11 x 7 mm (previously 11 x 8 mm). Craniocaudal extent appears similar with inferior margin difficult to distinguish from adjacent venous plexus. There is persistent but decreased extra-axial fluid along the cranioplasty communicating with collection extending into the suboccipital left paraspinal muscles. T2 hyperintensity along the left brachium pontis and at the left lateral pons likely reflects gliosis. Chronic blood products are present along the surgical tract. There is no acute infarction.  No hydrocephalus. Vascular: Major vessel flow voids at the skull base are preserved. Skull and upper cervical spine: Left retrosigmoid craniectomy with cranioplasty. Sinuses/Orbits: Paranasal sinuses are aerated. Orbits are unremarkable. Other: Sella is unremarkable. Patchy left mastoid fluid opacification. IMPRESSION: Similar size of residual meningioma compared to 07/24/2019. Persistent but decreased collection about the cranioplasty. Electronically Signed   By: Macy Mis M.D.   On: 11/04/2019 16:52     Imaging:  [place] Clinician Interpretation: I have personally reviewed the CNS images as listed.  My interpretation, in the context of the patient's clinical presentation, is stable disease  MR Brain W Wo Contrast  Result Date: 11/04/2019 CLINICAL DATA:  Meningioma post surgery and radiation, follow-up EXAM: MRI HEAD WITHOUT AND WITH CONTRAST TECHNIQUE: Multiplanar, multiecho pulse sequences of the brain and surrounding structures were obtained without and with intravenous contrast. CONTRAST:  73mL MULTIHANCE GADOBENATE DIMEGLUMINE 529 MG/ML IV SOLN COMPARISON:  07/24/2019 FINDINGS: Brain: Residual meningioma along the left tentorial insertion within the left perimesencephalic and prepontine cistern measures similar in size on representative axial image 67 of series 11 at 11 x 7 mm (previously 11 x 8 mm). Craniocaudal extent appears similar with inferior margin difficult to distinguish from adjacent  venous plexus. There is persistent but decreased extra-axial fluid along the cranioplasty communicating with collection extending into the suboccipital left paraspinal muscles. T2 hyperintensity along the left brachium pontis and at the left lateral pons likely reflects gliosis. Chronic blood products are present along the surgical tract. There is no acute infarction.  No hydrocephalus. Vascular: Major vessel flow voids at the skull base are preserved. Skull and upper cervical spine: Left retrosigmoid craniectomy with cranioplasty. Sinuses/Orbits: Paranasal sinuses are aerated. Orbits are unremarkable. Other: Sella is unremarkable. Patchy left mastoid fluid opacification. IMPRESSION: Similar size of residual meningioma compared to 07/24/2019. Persistent but decreased collection about the cranioplasty. Electronically Signed   By: Macy Mis M.D.   On: 11/04/2019 16:52     Assessment/Plan Meningioma ([place]) [D32.9]  We appreciate the opportunity to participate in the care of [place].  She is  clinically and radiographically stable.  For headaches, there is strong clinical suspicion for occipital neuralgia.  Will recommend referral to Dr. Maryjean Ka for greater occipital nerve blockade.  Breakthrough headaches may be treated with over the counter analgesia in the interim.  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  We ask that Rise Mu return to clinic in 6 months following next brain MRI, or sooner as needed.  All questions were answered. The patient knows to call the clinic with any problems, questions or concerns. No barriers to learning were detected.  The total time spent in the encounter was 40 minutes and more than 50% was on counseling and review of test results   Ventura Sellers, MD Medical Director of Neuro-Oncology [facility] at [place] 11/12/19 11:52 AM

## 2019-11-13 ENCOUNTER — Telehealth: Payer: Self-pay | Admitting: Internal Medicine

## 2019-11-13 NOTE — Telephone Encounter (Signed)
Scheduled per los. Called and left msg. Mailed printout  °

## 2019-11-16 DIAGNOSIS — E119 Type 2 diabetes mellitus without complications: Secondary | ICD-10-CM | POA: Diagnosis not present

## 2019-11-16 DIAGNOSIS — E1165 Type 2 diabetes mellitus with hyperglycemia: Secondary | ICD-10-CM | POA: Diagnosis not present

## 2019-11-16 DIAGNOSIS — E890 Postprocedural hypothyroidism: Secondary | ICD-10-CM | POA: Diagnosis not present

## 2019-11-18 ENCOUNTER — Other Ambulatory Visit: Payer: Self-pay | Admitting: Radiation Therapy

## 2019-11-19 DIAGNOSIS — Z8639 Personal history of other endocrine, nutritional and metabolic disease: Secondary | ICD-10-CM | POA: Diagnosis not present

## 2019-11-19 DIAGNOSIS — E119 Type 2 diabetes mellitus without complications: Secondary | ICD-10-CM | POA: Diagnosis not present

## 2019-11-19 DIAGNOSIS — Z7189 Other specified counseling: Secondary | ICD-10-CM | POA: Diagnosis not present

## 2019-11-19 DIAGNOSIS — E890 Postprocedural hypothyroidism: Secondary | ICD-10-CM | POA: Diagnosis not present

## 2019-11-26 DIAGNOSIS — Z124 Encounter for screening for malignant neoplasm of cervix: Secondary | ICD-10-CM | POA: Diagnosis not present

## 2019-11-26 DIAGNOSIS — N920 Excessive and frequent menstruation with regular cycle: Secondary | ICD-10-CM | POA: Diagnosis not present

## 2019-11-26 DIAGNOSIS — Z3040 Encounter for surveillance of contraceptives, unspecified: Secondary | ICD-10-CM | POA: Diagnosis not present

## 2019-11-26 DIAGNOSIS — Z1239 Encounter for other screening for malignant neoplasm of breast: Secondary | ICD-10-CM | POA: Diagnosis not present

## 2019-11-26 DIAGNOSIS — Z01419 Encounter for gynecological examination (general) (routine) without abnormal findings: Secondary | ICD-10-CM | POA: Diagnosis not present

## 2019-11-26 DIAGNOSIS — Z1231 Encounter for screening mammogram for malignant neoplasm of breast: Secondary | ICD-10-CM | POA: Diagnosis not present

## 2019-12-25 DIAGNOSIS — H4922 Sixth [abducent] nerve palsy, left eye: Secondary | ICD-10-CM | POA: Diagnosis not present

## 2020-02-23 ENCOUNTER — Other Ambulatory Visit: Payer: Self-pay | Admitting: Internal Medicine

## 2020-02-23 DIAGNOSIS — E119 Type 2 diabetes mellitus without complications: Secondary | ICD-10-CM | POA: Diagnosis not present

## 2020-02-23 DIAGNOSIS — Z8639 Personal history of other endocrine, nutritional and metabolic disease: Secondary | ICD-10-CM | POA: Diagnosis not present

## 2020-02-23 DIAGNOSIS — R490 Dysphonia: Secondary | ICD-10-CM

## 2020-02-23 DIAGNOSIS — E890 Postprocedural hypothyroidism: Secondary | ICD-10-CM | POA: Diagnosis not present

## 2020-03-10 ENCOUNTER — Other Ambulatory Visit: Payer: BC Managed Care – PPO

## 2020-03-11 ENCOUNTER — Ambulatory Visit
Admission: RE | Admit: 2020-03-11 | Discharge: 2020-03-11 | Disposition: A | Discharge status: Home / Self Care | Payer: BC Managed Care – PPO | Source: Ambulatory Visit | Attending: Internal Medicine | Admitting: Internal Medicine

## 2020-03-11 DIAGNOSIS — E041 Nontoxic single thyroid nodule: Secondary | ICD-10-CM | POA: Diagnosis not present

## 2020-03-11 DIAGNOSIS — R490 Dysphonia: Secondary | ICD-10-CM

## 2020-03-11 DIAGNOSIS — Z8639 Personal history of other endocrine, nutritional and metabolic disease: Secondary | ICD-10-CM

## 2020-04-21 DIAGNOSIS — Z1211 Encounter for screening for malignant neoplasm of colon: Secondary | ICD-10-CM | POA: Diagnosis not present

## 2020-04-21 DIAGNOSIS — K219 Gastro-esophageal reflux disease without esophagitis: Secondary | ICD-10-CM | POA: Diagnosis not present

## 2020-05-09 ENCOUNTER — Other Ambulatory Visit: Payer: Self-pay

## 2020-05-09 ENCOUNTER — Ambulatory Visit
Admission: RE | Admit: 2020-05-09 | Discharge: 2020-05-09 | Disposition: A | Discharge status: Home / Self Care | Payer: BC Managed Care – PPO | Source: Ambulatory Visit | Attending: Internal Medicine | Admitting: Internal Medicine

## 2020-05-09 DIAGNOSIS — D329 Benign neoplasm of meninges, unspecified: Secondary | ICD-10-CM | POA: Diagnosis not present

## 2020-05-09 MED ORDER — GADOBENATE DIMEGLUMINE 529 MG/ML IV SOLN
20.0000 mL | Freq: Once | INTRAVENOUS | Status: AC | PRN
  Administered 2020-05-09: 20 mL via INTRAVENOUS

## 2020-05-13 ENCOUNTER — Other Ambulatory Visit: Payer: Self-pay

## 2020-05-13 ENCOUNTER — Inpatient Hospital Stay: Payer: BC Managed Care – PPO | Attending: Internal Medicine | Admitting: Internal Medicine

## 2020-05-13 VITALS — BP 150/82 | HR 79 | Temp 97.8°F | Resp 20 | Ht 68.0 in | Wt 237.5 lb

## 2020-05-13 DIAGNOSIS — G43909 Migraine, unspecified, not intractable, without status migrainosus: Secondary | ICD-10-CM | POA: Diagnosis not present

## 2020-05-13 DIAGNOSIS — D329 Benign neoplasm of meninges, unspecified: Secondary | ICD-10-CM | POA: Insufficient documentation

## 2020-05-13 MED ORDER — AMITRIPTYLINE HCL 50 MG PO TABS: 50.0000 mg | ORAL_TABLET | Freq: Every day | ORAL | 3 refills | Status: DC

## 2020-05-13 NOTE — Progress Notes (Signed)
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 Zada Finders 07/30/19: SRS to residual tumor Tricia Soto)  Interval History:  Tricia Soto presents today for follow up after recent MRI brain.  Visual symptoms have improved considerably, no longer requiring prisms.  Continues to have frequent headaches, almost daily, mostly migrainous with occipital neuralgia features. Occasionally there are tension type headaches as well.  Not using much pain medicine at all, lots of stress with her job; she is planning to put 2-week notice in today.  H+P (08/03/19) Patient presents today for follow up after having completed radiation therapy last week.  She initially began experiencing left sided head/facial pain episodes in September 2020, different in nature from her established migraine headache syndrome.  After no relief from traditional pain medications, she obtained as CT (and later MRI) which demonstrated mass abutting the clivus and brainstem.  She went for resection with Dr. Zada Finders on 06/23/19, after which she began to experience double vision.  Double vision, which is persistent to today, is described as "seeing objects side by side when looking straight ahead, worse to the left".  In addition she has experienced some facial numbness, although this is not dense or consistent.  Finally, she feels her left ear hearing was poor following surgery, but this has improved considerably, nearly to baseline.  No issues with radiation.  Currently she is concerned about her ability to drive and return to work given her  visual impairments.  Medications: Current Outpatient Medications on File Prior to Visit  Medication Sig Dispense Refill  . amitriptyline (ELAVIL) 10 MG tablet Take 10 mg by mouth at bedtime.    Marland Kitchen atorvastatin (LIPITOR) 10 MG tablet Take 10 mg by mouth daily.    . butalbital-acetaminophen-caffeine (FIORICET) 50-325-40 MG tablet Take 1 tablet by mouth every 6 (six) hours as needed. For migraine    . Continuous Blood Gluc Sensor (DEXCOM G6 SENSOR) MISC USE 1 SENSOR AS DIRECTED EVERY 10 DAYS    . Continuous Blood Gluc Transmit (DEXCOM G6 TRANSMITTER) MISC USE 1 DEVICE EVERY 3 MONTHS    . dexamethasone (DECADRON) 4 MG tablet Take 1 tablet (4 mg total) by mouth daily. (Patient not taking: Reported on 11/12/2019) 7 tablet 0  . gabapentin (NEURONTIN) 100 MG capsule Take 100 mg by mouth at bedtime.    . hydrochlorothiazide (HYDRODIURIL) 12.5 MG tablet Take by mouth.    Marland Kitchen HYDROcodone-acetaminophen (NORCO/VICODIN) 5-325 MG tablet Take 1 tablet by mouth every 4 (four) hours as needed (pain). (Patient not taking: Reported on 11/12/2019) 30 tablet 0  . hydrOXYzine (ATARAX/VISTARIL) 50 MG tablet Take 50 mg by mouth 3 (three) times daily as needed for anxiety.     . insulin lispro (HUMALOG KWIKPEN) 100 UNIT/ML KwikPen Inject 4-8 Units into the skin 3 (three) times daily as needed (blood sugar 150 or above).    Marland Kitchen losartan (COZAAR) 25 MG tablet Take 25 mg by mouth daily.    . meclizine (ANTIVERT) 25 MG tablet Take 1 tablet (25 mg total) by mouth 3 (three) times daily as needed for dizziness. (Patient not taking: Reported on 11/12/2019) 30 tablet 0  . ondansetron (  ZOFRAN) 4 MG tablet Take 1 tablet (4 mg total) by mouth every 8 (eight) hours as needed for nausea or vomiting. (Patient not taking: Reported on 11/12/2019) 30 tablet 1  . OZEMPIC, 0.25 OR 0.5 MG/DOSE, 2 MG/1.5ML SOPN SMARTSIG:0.25 Milligram(s) SUB-Q Once a Week     No current facility-administered medications on file prior to visit.    Allergies:  Allergies   Allergen Reactions  . Lisinopril Cough and Other (See Comments)  . Tape Other (See Comments)    Electrodes used for EKG. Leaves "marks" on the skin. Patient told to be careful using bandages. Ok to use Paper Tape.    Past Medical History:  Past Medical History:  Diagnosis Date  . Chronic headaches   . Diabetes mellitus without complication ([place])   . GERD (gastroesophageal reflux disease)   . Hypertension   . Meningioma [facility])    Past Surgical History:  Past Surgical History:  Procedure Laterality Date  . APPLICATION OF CRANIAL NAVIGATION N/A 06/23/2019   Procedure: APPLICATION OF CRANIAL NAVIGATION;  Surgeon: Judith Part, MD;  Location: [facility];  Service: Neurosurgery;  Laterality: N/A;  . CRANIOTOMY Left 06/23/2019   Procedure: Left craniotomy for tumor resection with brainlab/facial nerve monitoring;  Surgeon: Judith Part, MD;  Location: [facility];  Service: Neurosurgery;  Laterality: Left;  . THYROIDECTOMY, PARTIAL    . TUBAL LIGATION     1997  . tubal ligation reversal  2013   Social History:  Social History   Socioeconomic History  . Marital status: Married    Spouse name: Not on file  . Number of children: Not on file  . Years of education: Not on file  . Highest education level: Not on file  Occupational History  . Not on file  Tobacco Use  . Smoking status: Never Smoker  . Smokeless tobacco: Never Used  Vaping Use  . Vaping Use: Never used  Substance and Sexual Activity  . Alcohol use: Not Currently  . Drug use: Never  . Sexual activity: Not on file  Other Topics Concern  . Not on file  Social History Narrative  . Not on file   Social Determinants of Health   Financial Resource Strain:   . Difficulty of Paying Living Expenses: Not on file  Food Insecurity:   . Worried About Charity fundraiser in the Last Year: Not on file  . Ran Out of Food in the Last Year: Not on file  Transportation Needs:   . Lack of Transportation (Medical): Not on  file  . Lack of Transportation (Non-Medical): Not on file  Physical Activity:   . Days of Exercise per Week: Not on file  . Minutes of Exercise per Session: Not on file  Stress:   . Feeling of Stress : Not on file  Social Connections:   . Frequency of Communication with Friends and Family: Not on file  . Frequency of Social Gatherings with Friends and Family: Not on file  . Attends Religious Services: Not on file  . Active Member of Clubs or Organizations: Not on file  . Attends Archivist Meetings: Not on file  . Marital Status: Not on file  Intimate Partner Violence:   . Fear of Current or Ex-Partner: Not on file  . Emotionally Abused: Not on file  . Physically Abused: Not on file  . Sexually Abused: Not on file   Family History: No family history on file.  Review of Systems: Constitutional: Denies fevers,  chills or abnormal weight loss Eyes: Denies blurriness of vision Ears, nose, mouth, throat, and face: Denies mucositis or sore throat Respiratory: Denies cough, dyspnea or wheezes Cardiovascular: Denies palpitation, chest discomfort or lower extremity swelling Gastrointestinal:  Denies nausea, constipation, diarrhea GU: Denies dysuria or incontinence Skin: Denies abnormal skin rashes Neurological: Per HPI Musculoskeletal: Denies joint pain, back or neck discomfort. No decrease in ROM Behavioral/Psych: Denies anxiety, disturbance in thought content, and mood instability  Physical Exam: Vitals:   05/13/20 1019  BP: (!) 150/82  Pulse: 79  Resp: 20  Temp: 97.8 F (36.6 C)  SpO2: 100%   KPS: 80. General: Alert, cooperative, pleasant, in no acute distress Head: Normal EENT: No conjunctival injection or scleral icterus. Oral mucosa moist Lungs: Resp effort normal Cardiac: Regular rate and rhythm Abdomen:  non-distended abdomen Skin: No rashes cyanosis or petechiae. Extremities: No clubbing or edema  Neurologic Exam: Mental Status: Awake, alert, attentive  to examiner. Oriented to self and environment. Language is fluent with intact comprehension.  Cranial Nerves: Visual acuity is grossly normal. Visual fields are full. Noted "dense" CN VI paresis on left. No ptosis. Face is symmetric. Motor: Tone and bulk are normal. Power is full in both arms and legs. Reflexes are symmetric, no pathologic reflexes present. Sensory: Intact to light touch and temperature Gait: Normal   Labs: I have reviewed the data as listed    Component Value Date/Time   NA 141 06/23/2019 1145   K 3.9 06/23/2019 1145   CL 109 06/19/2019 1001   CO2 25 06/19/2019 1001   GLUCOSE 130 (H) 06/19/2019 1001   BUN 7 06/19/2019 1001   CREATININE 0.83 06/23/2019 1536   CALCIUM 8.6 (L) 06/19/2019 1001   GFRNONAA >60 06/23/2019 1536   GFRAA >60 06/23/2019 1536   Lab Results  Component Value Date   WBC 15.4 (H) 06/23/2019   NEUTROABS 5.2 04/22/2019   HGB 10.3 (L) 06/23/2019   HCT 31.2 (L) 06/23/2019   MCV 86.0 06/23/2019   PLT 233 06/23/2019    Imaging:  [place] Clinician Interpretation: I have personally reviewed the CNS images as listed.  My interpretation, in the context of the patient's clinical presentation, is stable disease  MR BRAIN W WO CONTRAST  Result Date: 05/10/2020 CLINICAL DATA:  Meningioma follow-up EXAM: MRI HEAD WITHOUT AND WITH CONTRAST TECHNIQUE: Multiplanar, multiecho pulse sequences of the brain and surrounding structures were obtained without and with intravenous contrast. CONTRAST:  42mL MULTIHANCE GADOBENATE DIMEGLUMINE 529 MG/ML IV SOLN COMPARISON:  Brain MRI 04/22/2019, 11/04/2019 FINDINGS: Brain: No acute infarct, acute hemorrhage or extra-axial collection. Mild gliosis at the level of the left middle cerebellar peduncle is unchanged. There are old blood products at the meningioma resection site ventral to the brainstem. Residual mass anterolateral to the left cerebral peduncle is unchanged in size measuring 10 x 9 mm (series 13, image 52). Normal  volume of CSF spaces. No chronic microhemorrhage. Vascular: Normal flow voids. Skull and upper cervical spine: Remote left retrosigmoid craniectomy with cranioplasty. Sinuses/Orbits: Negative. Other: None. IMPRESSION: Unchanged size of 10 x 9 mm residual meningioma Electronically Signed   By: Ulyses Jarred M.D.   On: 05/10/2020 01:42     Assessment/Plan Meningioma ([place]) [D32.9]  We appreciate the opportunity to participate in the care of [place].  She is clinically and radiographically stable.  Recommended resuming/increasing Elavil for headache prophylaxis; will start at 50mg  HS.    She will also make lifestyle changes towards stress reduction, which could be  acting as trigger for headaches.   For opthalmoplegia, will continue to follow with neuro-optho at [facility] as needed.   We will call her in 1 month to assess response to Elavil.  Otherwise we ask that Tricia Soto return to clinic in 9 months following next brain MRI, or sooner as needed.  All questions were answered. The patient knows to call the clinic with any problems, questions or concerns. No barriers to learning were detected.  I have spent a total of 30 minutes of face-to-face and non-face-to-face time, excluding clinical staff time, preparing to see patient, ordering tests and/or medications, counseling the patient, and independently interpreting results and communicating results to the patient/family/caregiver    Ventura Sellers, MD Medical Director of Neuro-Oncology [facility] at [place] 05/13/20 10:16 AM

## 2020-05-16 ENCOUNTER — Inpatient Hospital Stay: Payer: BC Managed Care – PPO

## 2020-05-16 ENCOUNTER — Telehealth: Payer: Self-pay | Admitting: Internal Medicine

## 2020-05-16 NOTE — Telephone Encounter (Signed)
Scheduled per 10/1 los. Unable to reach pt. Left voicemail with appt time and date.

## 2020-06-10 ENCOUNTER — Inpatient Hospital Stay (HOSPITAL_BASED_OUTPATIENT_CLINIC_OR_DEPARTMENT_OTHER): Payer: BC Managed Care – PPO | Admitting: Internal Medicine

## 2020-06-10 DIAGNOSIS — G43909 Migraine, unspecified, not intractable, without status migrainosus: Secondary | ICD-10-CM

## 2020-06-10 DIAGNOSIS — D329 Benign neoplasm of meninges, unspecified: Secondary | ICD-10-CM

## 2020-06-10 NOTE — Progress Notes (Signed)
I connected with Tricia Soto on 06/10/20 at 12:00 PM EDT by telephone visit and verified that I am speaking with the correct person using two identifiers.  I discussed the limitations, risks, security and privacy concerns of performing an evaluation and management service by telemedicine and the availability of in-person appointments. I also discussed with the patient that there may be a patient responsible charge related to this service. The patient expressed understanding and agreed to proceed.  Other persons participating in the visit and their role in the encounter:  n/a  Patient's location:  Home  Provider's location:  Office  Chief Complaint:  Meningioma ([place])  Migraine without status migrainosus, not intractable, unspecified migraine type  History of Present Ilness: Tricia Soto describes considerable improvement in her headache intensity, frequency since our prior visit one month ago.  She has transitioned nicely into new employment and her stress level is decreased.  Otherwise no new or progressive neurologic deficits.  Denies seizures, ataxia. Observations: Language and cognition at baseline Assessment and Plan: Meningioma ([place])  Migraine without status migrainosus, not intractable, unspecified migraine type  Recommend continuing Elavil 50mg  HS for migraine prevention.  Follow Up Instructions: RTC in 1 year as scheduled following next MRI brain, or sooner as needed  I discussed the assessment and treatment plan with the patient.  The patient was provided an opportunity to ask questions and all were answered.  The patient agreed with the plan and demonstrated understanding of the instructions.    The patient was advised to call back or seek an in-person evaluation if the symptoms worsen or if the condition fails to improve as anticipated.  I provided 5-10 minutes of non-face-to-face time during this enocunter.  Ventura Sellers, MD   I provided 10 minutes of non face-to-face  telephone visit time during this encounter, and > 50% was spent counseling as documented under my assessment & plan.

## 2020-06-13 ENCOUNTER — Telehealth: Payer: Self-pay | Admitting: Internal Medicine

## 2020-06-13 NOTE — Telephone Encounter (Signed)
No 10/29 los 

## 2020-09-06 IMAGING — MR MR HEAD WO/W CM
11 series · 48 of 48 positions shown · IV contrast (multihance)
Comparison: Brain MRI 06/24/2019

CLINICAL DATA: Meningioma status post surgery 06/23/2019. Planning
for stereotactic radio surgery.

EXAM:
MRI HEAD WITHOUT AND WITH CONTRAST
TECHNIQUE: Multiplanar, multiecho pulse sequences of the brain and surrounding
structures were obtained without and with intravenous contrast.
CONTRAST:  20mL MULTIHANCE GADOBENATE DIMEGLUMINE 529 MG/ML IV SOLN

[Series 2: FLAIR · sagittal · 3.0mm · 0.75mm/px · 2 of 39 slices shown (1 of 2)]
[im 1/39]
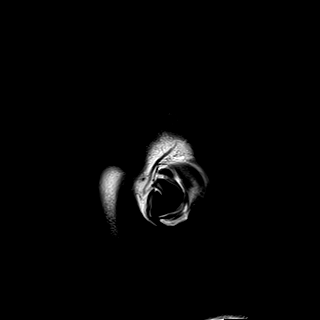
[im 39/39]
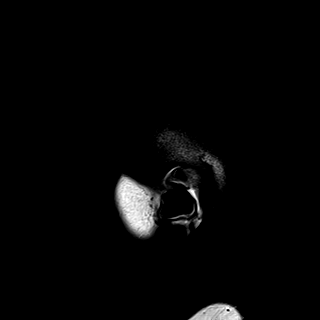

[Series 3: DWI · axial · 3.0mm · 1.50mm/px · z∈[-73,+83]mm · 5 of 82 slices shown (1 of 2)]
[im 1/82]
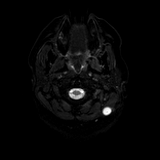
[im 21/82]
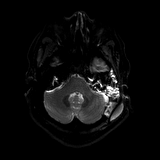
[im 41/82]
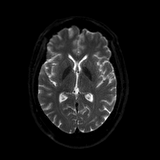
[im 61/82]
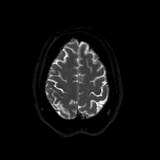
[im 82/82]
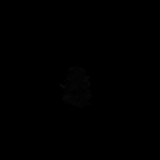

[Series 4: DWI · axial · 3.0mm · 1.50mm/px · z∈[-73,+83]mm · 2 of 41 slices shown (2 of 2)]
[im 1/41]
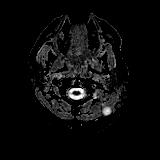
[im 41/41]
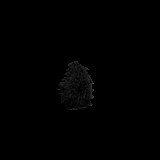

[Series 5: T2 · axial · 5.0mm · 0.57mm/px · z∈[-69,+87]mm · 2 of 27 slices shown]
[im 1/27]
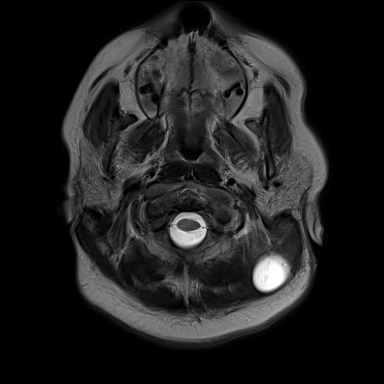
[im 27/27]
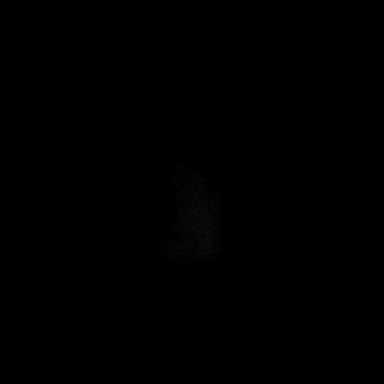

[Series 7: swi_images · axial · 1.5mm · 0.90mm/px · z∈[-64,+90]mm · 6 of 104 slices shown]
[im 1/104]
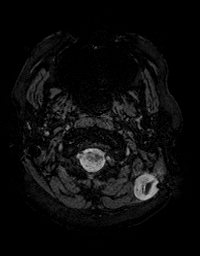
[im 21/104]
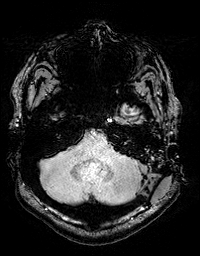
[im 42/104]
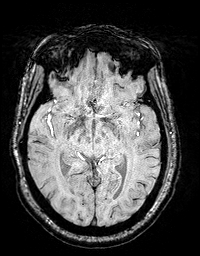
[im 62/104]
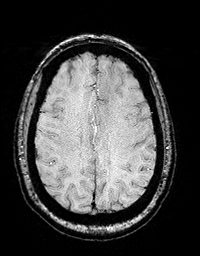
[im 83/104]
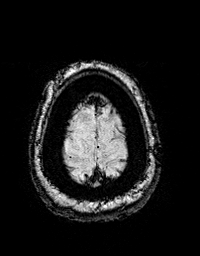
[im 104/104]
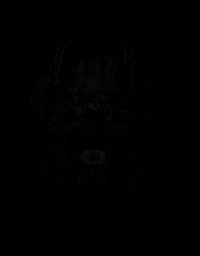

[Series 8: FLAIR · axial · 3.0mm · 0.57mm/px · z∈[-67,+89]mm · 3 of 53 slices shown (2 of 2)]
[im 1/53]
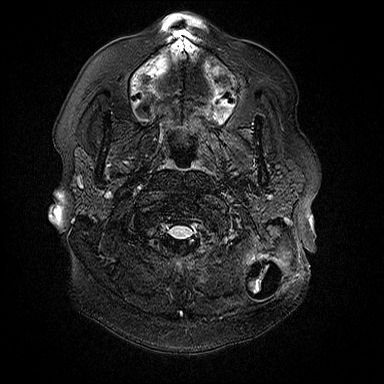
[im 27/53]
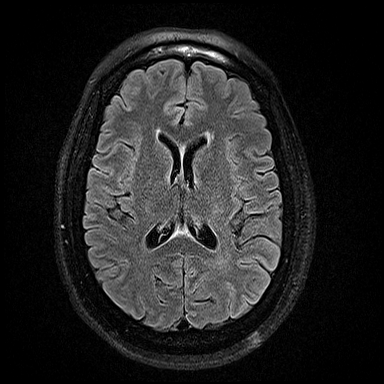
[im 53/53]
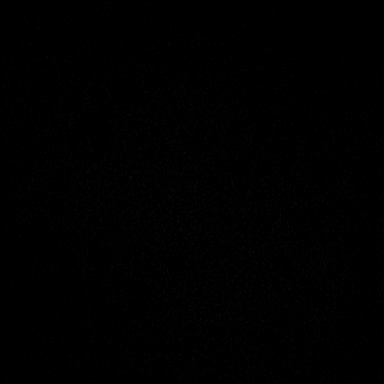

[Series 9: T1 · axial · 1.0mm · 0.75mm/px · z∈[-67,+90]mm · 10 of 158 slices shown]
[im 1/158]
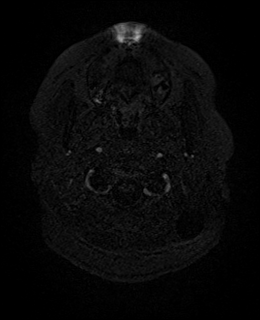
[im 18/158]
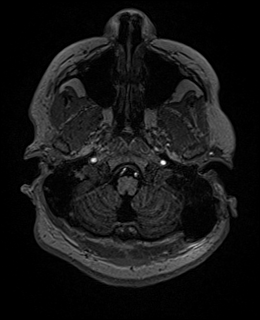
[im 35/158]
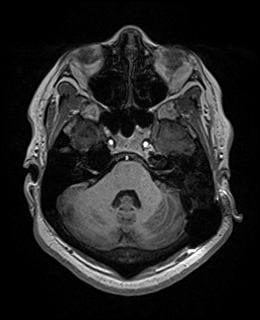
[im 53/158]
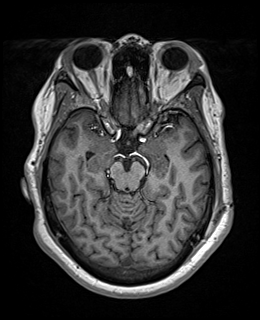
[im 70/158]
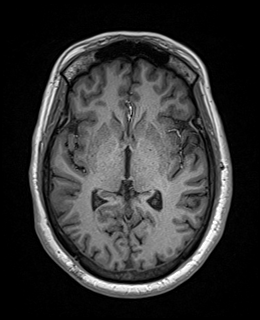
[im 88/158]
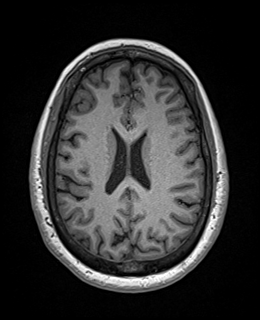
[im 105/158]
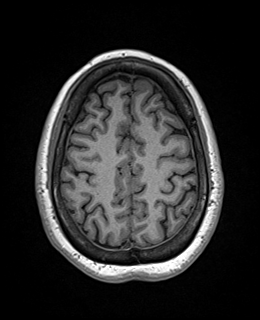
[im 123/158]
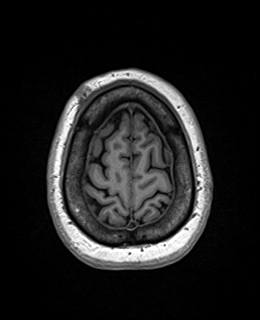
[im 140/158]
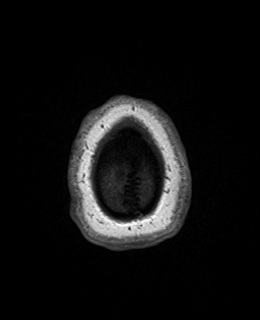
[im 158/158]
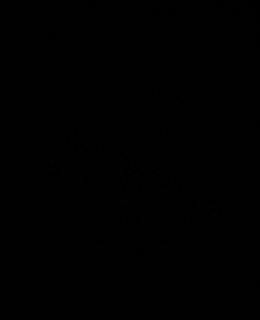

[Series 10: T2 post-contrast · coronal · 3.0mm · 0.57mm/px · 3 of 47 slices shown]
[im 1/47]
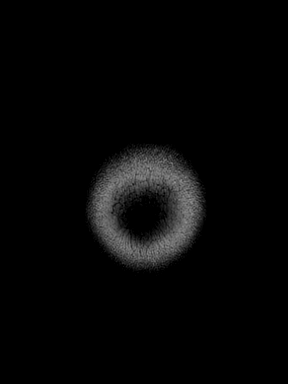
[im 24/47]
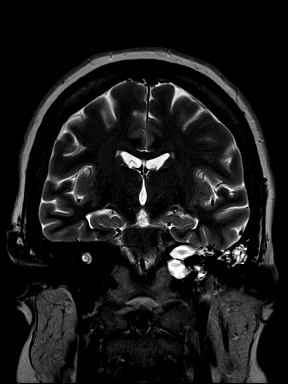
[im 47/47]
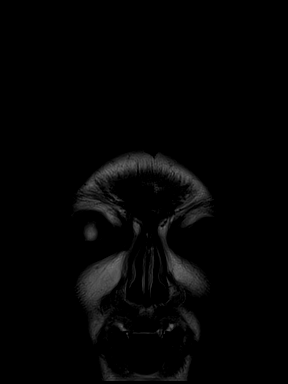

[Series 11: T1 post-contrast · axial · 1.0mm · 0.75mm/px · z∈[-67,+92]mm · 10 of 159 slices shown (1 of 2)]
[im 1/159]
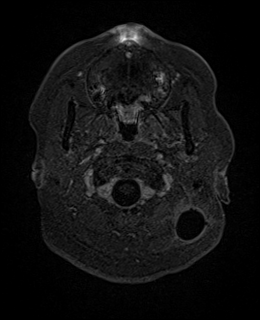
[im 18/159]
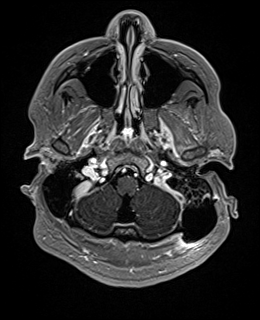
[im 36/159]
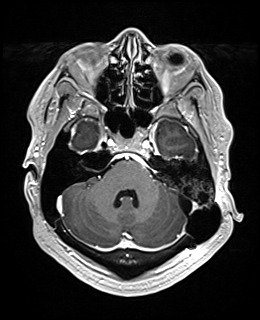
[im 53/159]
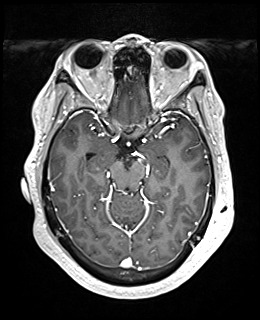
[im 71/159]
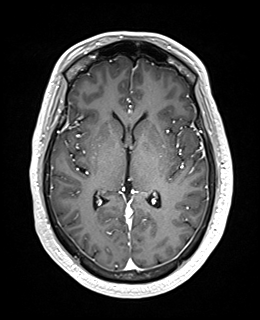
[im 88/159]
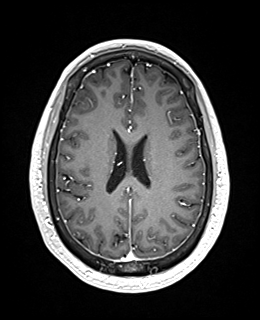
[im 106/159]
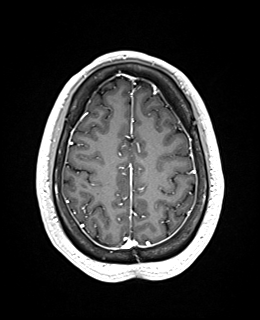
[im 123/159]
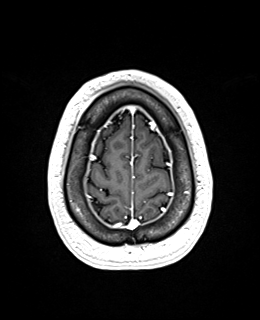
[im 141/159]
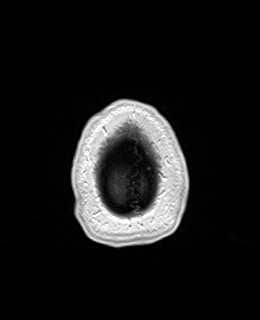
[im 159/159]
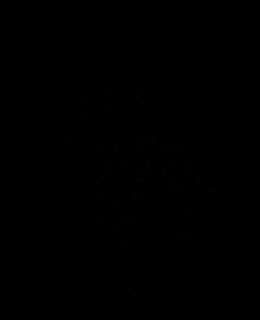

[Series 12: T1 post-contrast · coronal · 3.0mm · 0.57mm/px · 3 of 47 slices shown (2 of 2)]
[im 1/47]
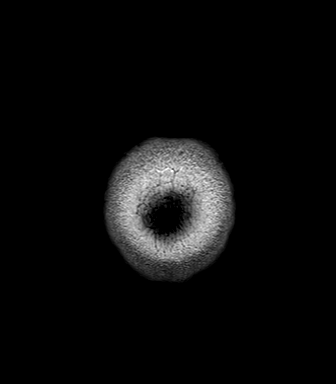
[im 24/47]
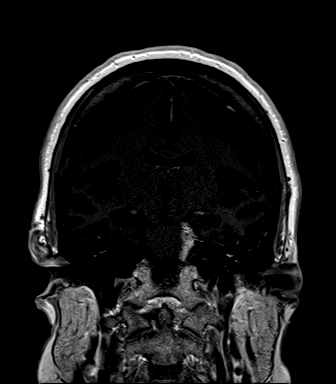
[im 47/47]
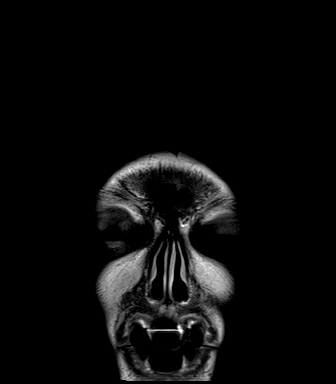

[Series 13: FLAIR post-contrast · sagittal · 3.0mm · 0.75mm/px · 2 of 39 slices shown]
[im 1/39]
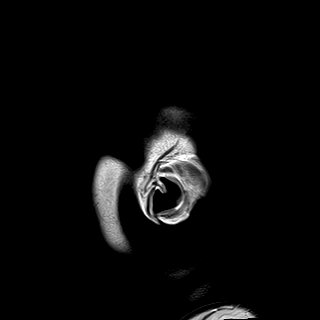
[im 39/39]
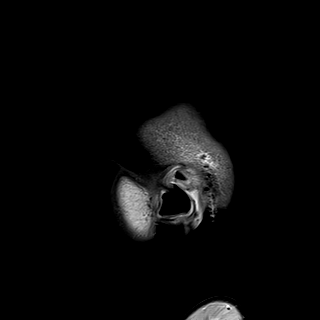

[48 of 48 positions shown; findings below may reference images not displayed]

FINDINGS: BRAIN: There is no acute infarct, acute hemorrhage or extra-axial
collection. Status post left retromastoid approach meningioma
resection. Hyperintense T2-weighted signal along the left aspect of
the brainstem is unchanged. The previously seen signal abnormalities
in the anterior left cerebellar hemisphere have resolved.
Supratentorial brain is normal. Chronic hemorrhagic focus in the
left pons is unchanged. The cerebral and cerebellar volume are
age-appropriate. There is no hydrocephalus. Small amount of contrast
enhancement within the left pons, new since the prior study.
Residual mass at the left anterior aspect of the midbrain measures
1.2 x 0.9 x 2.2 cm.

VASCULAR: The major intracranial arterial and venous sinus flow
voids are normal.

SKULL AND UPPER CERVICAL SPINE: Fluid throughout the left mastoid
and petrous apex. Small amount of fluid overlying the left
retromastoid craniotomy site.

SINUSES/ORBITS: There are no fluid levels or advanced mucosal
thickening. The orbits are normal.
IMPRESSION: 1. Unchanged residual meningioma measuring 1.2 x 0.9 x 2.2 cm
anterolateral to the upper brain stem.
2. New small amount of contrast enhancement within the left pons,
likely post-ischemic enhancement.
3. Resolution of previously seen signal abnormality in the anterior
left cerebellar hemisphere.
4. Fluid throughout the left mastoid and petrous apex.

## 2020-09-12 IMAGING — CT CT HEAD W/O CM
3 series · 15 of 47 positions shown, 18 images · non-contrast
Comparison: Prior MRI from 07/24/2019.

CLINICAL DATA: Initial evaluation for acute severe headache status
post radiation therapy today.

EXAM:
CT HEAD WITHOUT CONTRAST
TECHNIQUE: Contiguous axial images were obtained from the base of the skull
through the vertex without intravenous contrast.

[Series 2: head wo · axial · 0.47mm/px · z∈[-135,-10]mm · 9 of 31 slices shown, 12 images]
[im 3/31  brain]
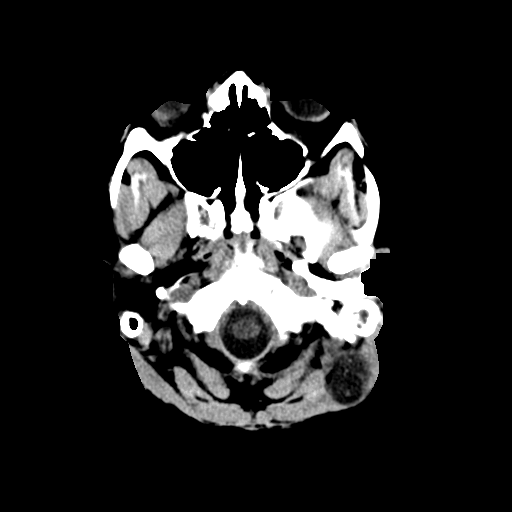
[im 3/31  bone]
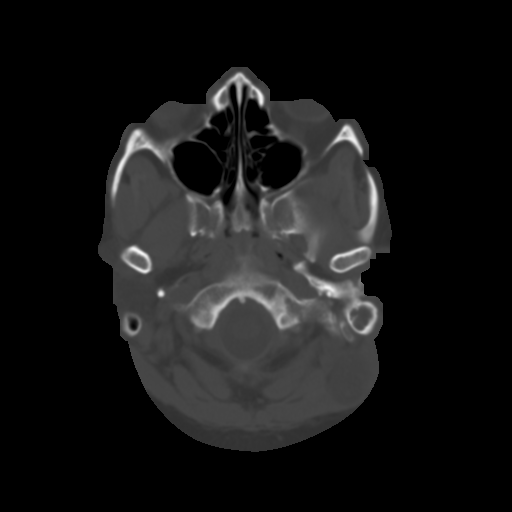
[im 6/31  brain]
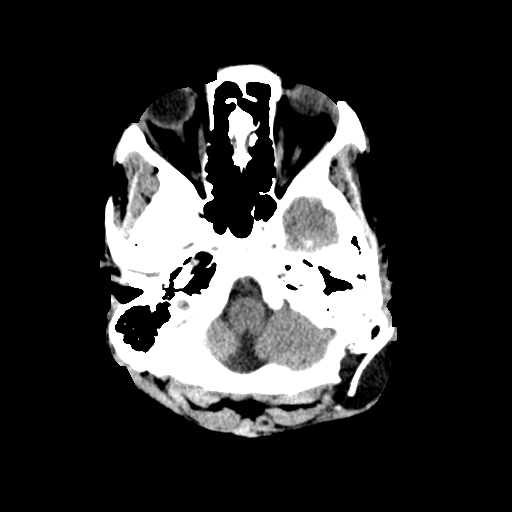
[im 9/31  brain]
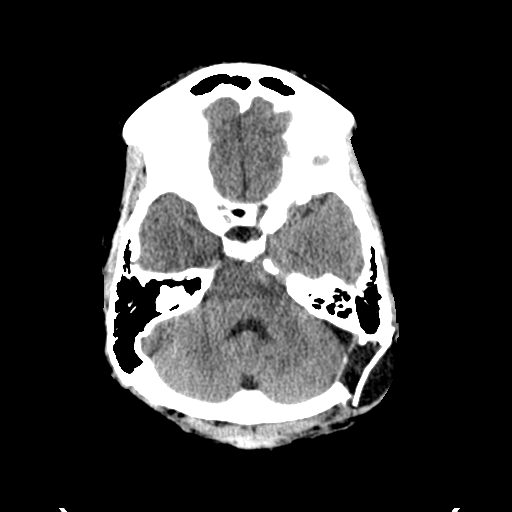
[im 12/31  brain]
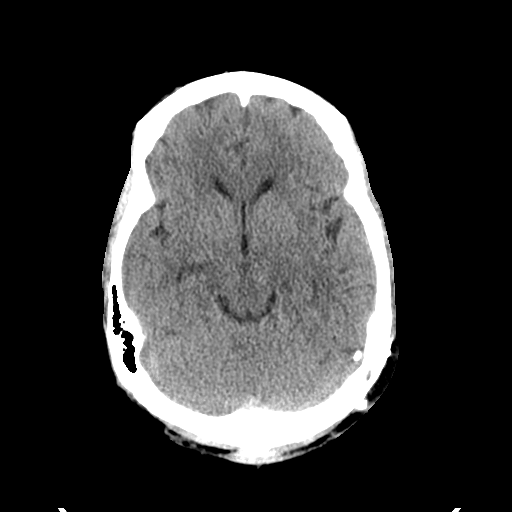
[im 16/31  brain]
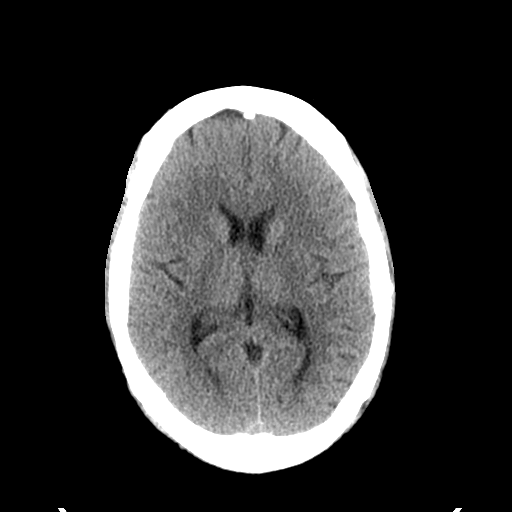
[im 16/31  bone]
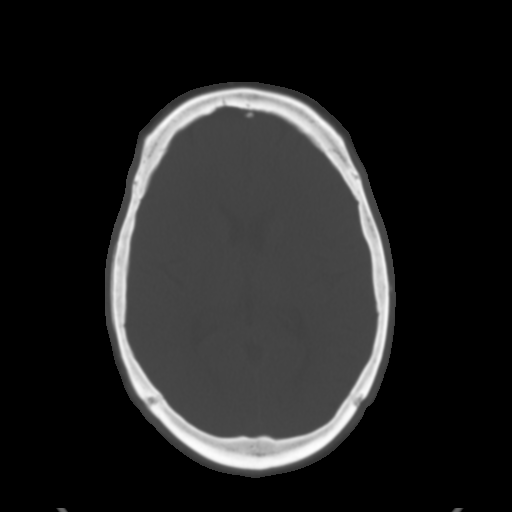
[im 19/31  brain]
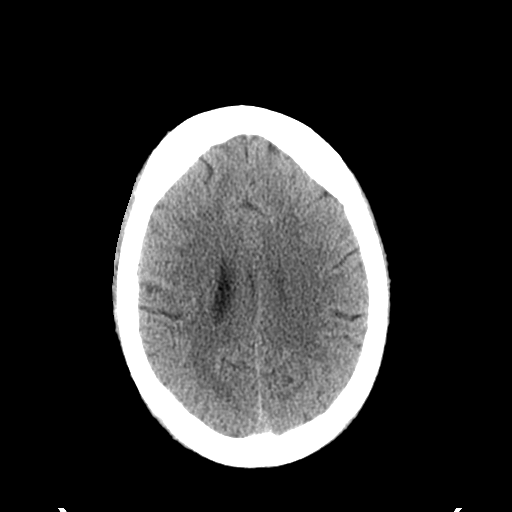
[im 22/31  brain]
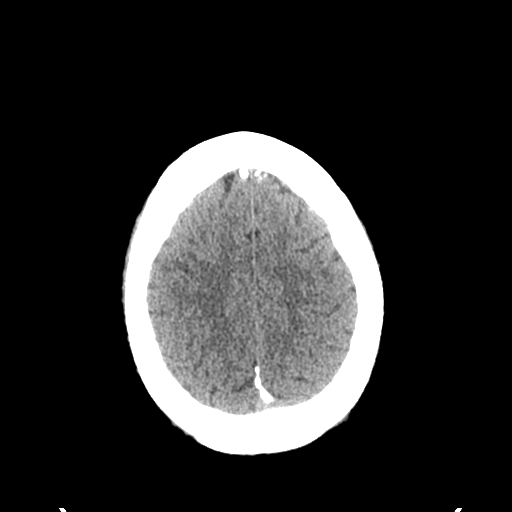
[im 25/31  brain]
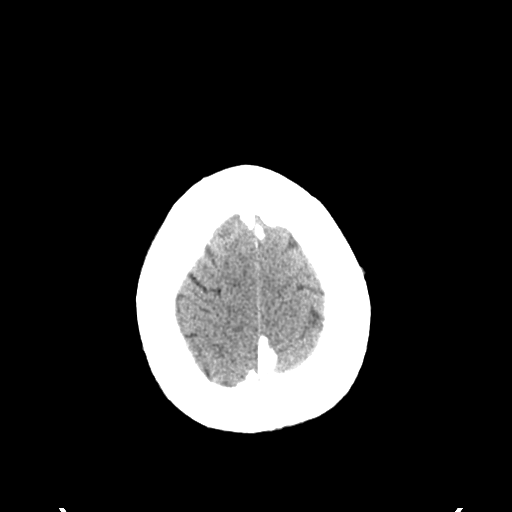
[im 28/31  brain]
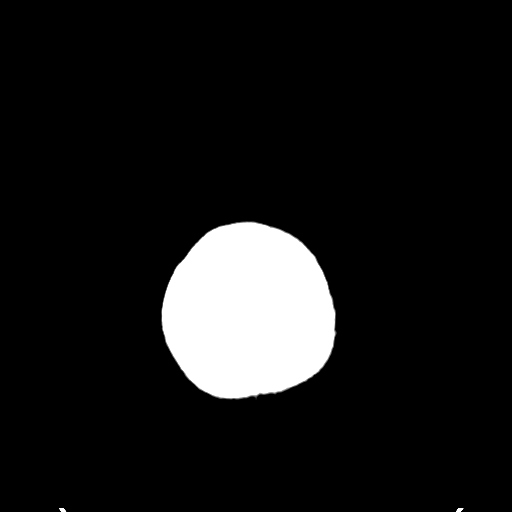
[im 28/31  bone]
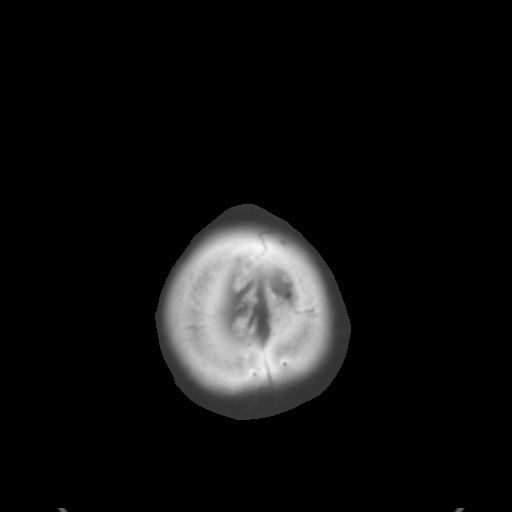

[Series 4: coronal soft tissue · coronal · 0.29mm/px · 3 of 65 slices shown]
[im 22/65  brain]
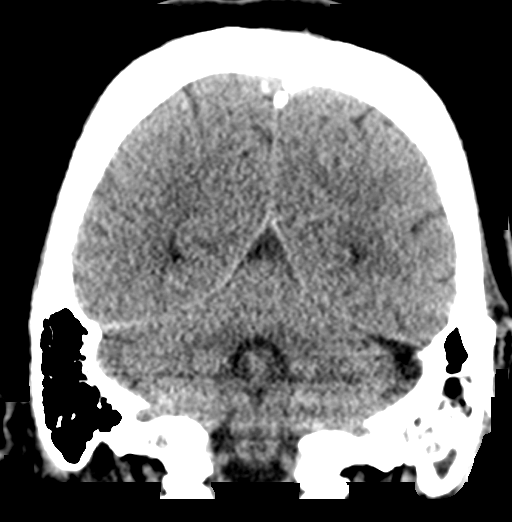
[im 29/65  brain]
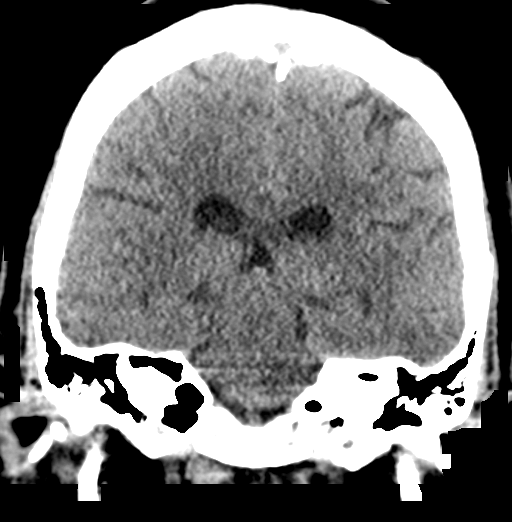
[im 36/65  brain]
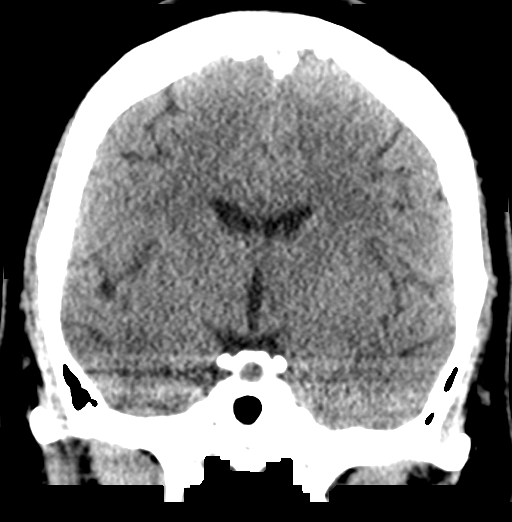

[Series 5: sagittal soft tissue · sagittal · 0.30mm/px · 3 of 48 slices shown]
[im 16/48  brain]
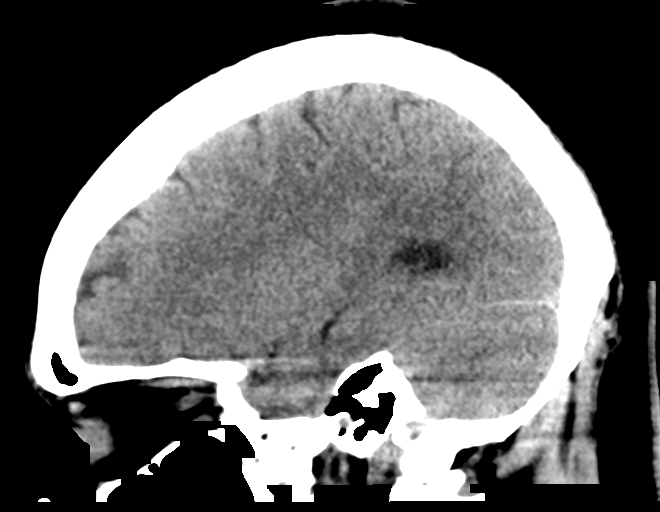
[im 24/48  brain]
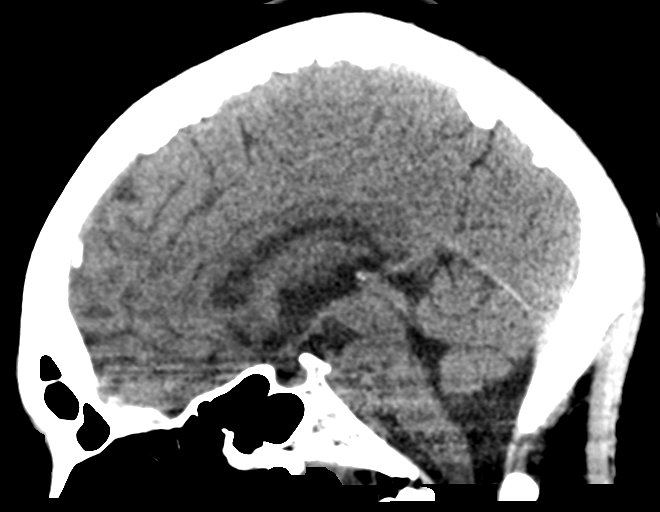
[im 32/48  brain]
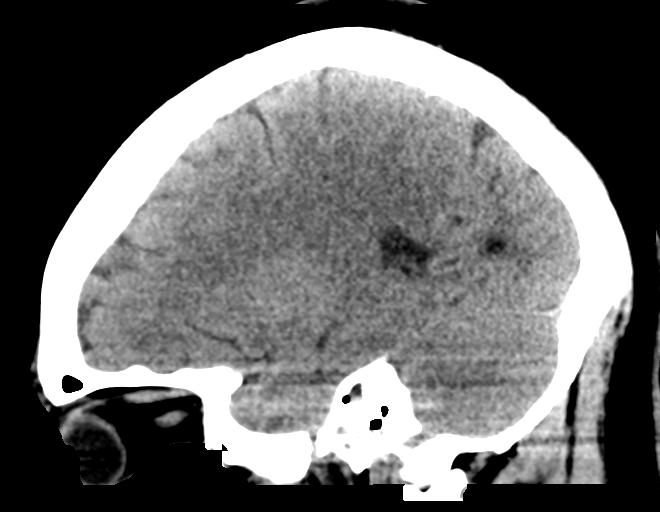

[15 of 47 positions shown; findings below may reference images not displayed]

FINDINGS: Brain: Postoperative changes from prior left retrosigmoid
craniectomy with cranioplasty again seen, stable. No acute
intracranial hemorrhage. No acute large vessel territory infarct.
Residual mass adjacent to the left midbrain grossly stable from
previous MRI, better seen on previous exam. No hydrocephalus. No
extra-axial fluid collection.

Vascular: No hyperdense vessel.

Skull: Prior left retrosigmoid craniectomy with cranioplasty. 3.6 cm
CSF collection about the cranioplasty mesh is stable from previous
MRI. No acute scalp soft tissue abnormality.

Sinuses/Orbits: Globes and orbital soft tissues within normal
limits. Paranasal sinuses are clear. Right mastoid air cells clear.
Postoperative left mastoid effusion stable from previous.

Other: None.
IMPRESSION: 1. No acute intracranial abnormality.
2. Stable postoperative changes from prior left retrosigmoid
craniectomy with cranioplasty. Stable 3.6 cm CSF collection about
the cranioplasty mesh.
3. Stable left mastoid effusion.

## 2020-10-13 DIAGNOSIS — I10 Essential (primary) hypertension: Secondary | ICD-10-CM | POA: Diagnosis not present

## 2020-10-20 ENCOUNTER — Emergency Department (HOSPITAL_COMMUNITY): Payer: Managed Care, Other (non HMO)

## 2020-10-20 ENCOUNTER — Encounter (HOSPITAL_COMMUNITY): Payer: Self-pay

## 2020-10-20 ENCOUNTER — Emergency Department (HOSPITAL_COMMUNITY)
Admission: EM | Admit: 2020-10-20 | Discharge: 2020-10-20 | Disposition: A | Discharge status: Home / Self Care | Payer: Managed Care, Other (non HMO) | Attending: Emergency Medicine | Admitting: Emergency Medicine

## 2020-10-20 DIAGNOSIS — Z79899 Other long term (current) drug therapy: Secondary | ICD-10-CM | POA: Diagnosis not present

## 2020-10-20 DIAGNOSIS — E119 Type 2 diabetes mellitus without complications: Secondary | ICD-10-CM | POA: Insufficient documentation

## 2020-10-20 DIAGNOSIS — Z85841 Personal history of malignant neoplasm of brain: Secondary | ICD-10-CM | POA: Diagnosis not present

## 2020-10-20 DIAGNOSIS — R1031 Right lower quadrant pain: Secondary | ICD-10-CM | POA: Diagnosis not present

## 2020-10-20 DIAGNOSIS — Z794 Long term (current) use of insulin: Secondary | ICD-10-CM | POA: Insufficient documentation

## 2020-10-20 DIAGNOSIS — K219 Gastro-esophageal reflux disease without esophagitis: Secondary | ICD-10-CM | POA: Insufficient documentation

## 2020-10-20 DIAGNOSIS — I10 Essential (primary) hypertension: Secondary | ICD-10-CM | POA: Insufficient documentation

## 2020-10-20 LAB — COMPREHENSIVE METABOLIC PANEL
ALT: 11 U/L (ref 0–44)
AST: 11 U/L — ABNORMAL LOW (ref 15–41)
Albumin: 3.8 g/dL (ref 3.5–5.0)
Alkaline Phosphatase: 102 U/L (ref 38–126)
Anion gap: 13 (ref 5–15)
BUN: 15 mg/dL (ref 6–20)
CO2: 26 mmol/L (ref 22–32)
Calcium: 9.3 mg/dL (ref 8.9–10.3)
Chloride: 98 mmol/L (ref 98–111)
Creatinine, Ser: 1.3 mg/dL — ABNORMAL HIGH (ref 0.44–1.00)
GFR, Estimated: 51 mL/min — ABNORMAL LOW (ref >60)
Glucose, Bld: 389 mg/dL — ABNORMAL HIGH (ref 70–99)
Potassium: 3.6 mmol/L (ref 3.5–5.1)
Sodium: 137 mmol/L (ref 135–145)
Total Bilirubin: 1.1 mg/dL (ref 0.3–1.2)
Total Protein: 8.3 g/dL — ABNORMAL HIGH (ref 6.5–8.1)

## 2020-10-20 LAB — URINALYSIS, ROUTINE W REFLEX MICROSCOPIC
Bilirubin Urine: NEGATIVE
Glucose, UA: >=500 mg/dL — AB
Ketones, ur: NEGATIVE mg/dL
Leukocytes,Ua: NEGATIVE
Nitrite: NEGATIVE
Protein, ur: 100 mg/dL — AB
Specific Gravity, Urine: 1.033 — ABNORMAL HIGH (ref 1.005–1.030)
pH: 5 (ref 5.0–8.0)

## 2020-10-20 LAB — CBC
HCT: 40.5 % (ref 36.0–46.0)
Hemoglobin: 13.5 g/dL (ref 12.0–15.0)
MCH: 29 pg (ref 26.0–34.0)
MCHC: 33.3 g/dL (ref 30.0–36.0)
MCV: 86.9 fL (ref 80.0–100.0)
Platelets: 294 10*3/uL (ref 150–400)
RBC: 4.66 MIL/uL (ref 3.87–5.11)
RDW: 12.1 % (ref 11.5–15.5)
WBC: 11 10*3/uL — ABNORMAL HIGH (ref 4.0–10.5)
nRBC: 0 % (ref 0.0–0.2)

## 2020-10-20 LAB — I-STAT BETA HCG BLOOD, ED (MC, WL, AP ONLY): I-stat hCG, quantitative: <5 m[IU]/mL (ref <5)

## 2020-10-20 LAB — LIPASE, BLOOD: Lipase: 43 U/L (ref 11–51)

## 2020-10-20 MED ORDER — SODIUM CHLORIDE 0.9 % IV BOLUS
1000.0000 mL | Freq: Once | INTRAVENOUS | Status: AC
  Administered 2020-10-20: 1000 mL via INTRAVENOUS

## 2020-10-20 MED ORDER — ONDANSETRON HCL 4 MG/2ML IJ SOLN
4.0000 mg | Freq: Once | INTRAMUSCULAR | Status: AC
  Administered 2020-10-20: 4 mg via INTRAVENOUS
  Filled 2020-10-20: qty 2

## 2020-10-20 MED ORDER — INSULIN ASPART 100 UNIT/ML ~~LOC~~ SOLN
8.0000 [IU] | Freq: Once | SUBCUTANEOUS | Status: AC
  Administered 2020-10-20: 8 [IU] via SUBCUTANEOUS
  Filled 2020-10-20: qty 0.08

## 2020-10-20 MED ORDER — DICYCLOMINE HCL 20 MG PO TABS
20.0000 mg | ORAL_TABLET | Freq: Two times a day (BID) | ORAL | 0 refills | Status: DC
Start: 2020-10-20 — End: 2022-02-22

## 2020-10-20 MED ORDER — ONDANSETRON 4 MG PO TBDP
4.0000 mg | ORAL_TABLET | Freq: Three times a day (TID) | ORAL | 0 refills | Status: AC | PRN
Start: 2020-10-20 — End: ?

## 2020-10-20 MED ORDER — MORPHINE SULFATE (PF) 4 MG/ML IV SOLN
4.0000 mg | Freq: Once | INTRAVENOUS | Status: AC
  Administered 2020-10-20: 4 mg via INTRAVENOUS
  Filled 2020-10-20: qty 1

## 2020-10-20 NOTE — ED Triage Notes (Signed)
Pt presents with c/o abdominal pain that started 3 weeks ago but has progressively gotten worse. Pt reports that she is having sharp pain in her RLQ.

## 2020-10-20 NOTE — Discharge Instructions (Addendum)
Your lab work today was reassuringly within normal limits.  Your creatinine was 1.3 when you follow-up with your primary care doctor you may compare this with prior lab work.  Please drink plenty of water.  You may always return to the ER for any new or concerning symptoms.  Please use Tylenol or ibuprofen for pain.  You may use 600 mg ibuprofen every 6 hours or 1000 mg of Tylenol every 6 hours.  You may choose to alternate between the 2.  This would be most effective.  Not to exceed 4 g of Tylenol within 24 hours.  Not to exceed 3200 mg ibuprofen 24 hours.  He may use Bentyl for pain as well and Zofran for nausea.

## 2020-10-20 NOTE — ED Provider Notes (Signed)
[facility] DEPT Provider Note   CSN: [id number] Arrival date & time: 10/20/20  1142     History Chief Complaint  Patient presents with  . Abdominal Pain    Tricia Soto is a 45 y.o. female.  HPI Patient is 45 year old female with past medical history significant for DM 2, HTN, meningioma  Patient is presented to the emergency room today with right lower quadrant abdominal pain for approximately 3 weeks.  She states that it seems to come and go and wax and wane.  Does not have any associated nausea, vomiting, fevers, chills.  She describes it as severe when it comes on as bad as 8/10 and seems to have no aggravating or mitigating factors such as exertion or breathing having an impact on her pain.  She describes the characteristic as sharp.  She denies any chest pain or shortness of breath.  She also denies lightheadedness or dizziness.  No urinary symptoms such as urinary frequency, urgency or dysuria or hematuria.  He has no history of kidney stones. She states that she just finished her period several days ago. She states her bowel movements have been unchanged.  No vaginal discharge no concern for STD.  No dyspareunia.  No other associate symptoms.  No other aggravating mitigating factors.    Past Medical History:  Diagnosis Date  . Chronic headaches   . Diabetes mellitus without complication ([place])   . GERD (gastroesophageal reflux disease)   . Hypertension   . Meningioma [facility])     Patient Active Problem List   Diagnosis Date Noted  . Meningioma ([place]) 06/23/2019  . Brain tumor ([place]) 06/23/2019  . Acute pain of right knee 12/30/2017  . Hypertension 08/20/2017  . 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 Insomnia 02/18/2012  . Migraine headache 10/15/2011  . Insulin dependent type 2 diabetes mellitus ([place]) 10/15/2011  . 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 BMI  37.0-37.9, adult 10/15/2011  . Type II or unspecified type diabetes mellitus without mention of complication, not stated as uncontrolled 09/22/2009  . S/P partial thyroidectomy 09/22/2009  . 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 APPLICATION OF CRANIAL NAVIGATION N/A 06/23/2019   Procedure: APPLICATION OF CRANIAL NAVIGATION;  Surgeon: Judith Part, MD;  Location: [facility];  Service: Neurosurgery;  Laterality: N/A;  . CRANIOTOMY Left 06/23/2019   Procedure: Left craniotomy for tumor resection with brainlab/facial nerve monitoring;  Surgeon: Judith Part, MD;  Location: [facility];  Service: Neurosurgery;  Laterality: Left;  . THYROIDECTOMY, PARTIAL    . TUBAL LIGATION     1997  . tubal ligation reversal  2013     OB History   No obstetric history on file.     History reviewed. No pertinent family history.  Social History   Tobacco Use  . Smoking status: Never Smoker  . Smokeless tobacco: Never Used  Vaping Use  . Vaping Use: Never used  Substance Use Topics  . Alcohol use: Not Currently  . Drug use: Never    Home Medications Prior to Admission medications   Medication Sig Start Date End Date Taking? Authorizing Provider  amitriptyline (ELAVIL) 50 MG tablet Take 1 tablet (50 mg total) by mouth at bedtime. 05/13/20  Yes Vaslow, Acey Lav, MD  atorvastatin (LIPITOR) 10 MG tablet Take 10 mg by mouth at bedtime. 10/30/19  Yes [provider]  dicyclomine (BENTYL) 20 MG tablet Take 1 tablet (20 mg total) by mouth 2 (two) times daily. 10/20/20  Yes Fondaw, Wylder S, PA  hydrochlorothiazide (HYDRODIURIL) 12.5 MG tablet Take 12.5 mg by mouth at bedtime. 10/30/19 10/29/20 Yes [provider]  hydrOXYzine (ATARAX/VISTARIL) 50 MG tablet Take 50 mg by mouth 3 (three) times daily as needed for anxiety.    Yes [provider]  hydrOXYzine (VISTARIL) 50 MG capsule Take 50 mg by mouth at bedtime. 10/13/20  Yes  [provider]  insulin lispro (HUMALOG) 100 UNIT/ML KwikPen Inject 4-8 Units into the skin 3 (three) times daily as needed (blood sugar 150 or above).   Yes [provider]  LANTUS SOLOSTAR 100 UNIT/ML Solostar Pen Inject 12 Units into the skin at bedtime. 03/23/20  Yes [provider]  losartan (COZAAR) 25 MG tablet Take 25 mg by mouth every evening. 04/13/19  Yes [provider]  ondansetron (ZOFRAN ODT) 4 MG disintegrating tablet Take 1 tablet (4 mg total) by mouth every 8 (eight) hours as needed for nausea or vomiting. 10/20/20  Yes Fondaw, Wylder S, PA  OZEMPIC, 1 MG/DOSE, 4 MG/3ML SOPN Inject 1 mg into the skin once a week. Take on Tues 10/12/20  Yes [provider]  butalbital-acetaminophen-caffeine (FIORICET) 50-325-40 MG tablet Take 1 tablet by mouth every 6 (six) hours as needed. For migraine 07/25/19   [provider]  Continuous Blood Gluc Transmit (DEXCOM G6 TRANSMITTER) MISC USE 1 DEVICE EVERY 3 MONTHS 10/11/19   [provider]  HYDROcodone-acetaminophen (NORCO/VICODIN) 5-325 MG tablet Take 1 tablet by mouth every 4 (four) hours as needed (pain). Patient not taking: No sig reported 06/26/19   Judith Part, MD    Allergies    Lisinopril and Tape  Review of Systems   Review of Systems  Constitutional: Negative for chills and fever.  HENT: Negative for congestion.   Eyes: Negative for pain.  Respiratory: Negative for cough and shortness of breath.   Cardiovascular: Negative for chest pain and leg swelling.  Gastrointestinal: Positive for abdominal pain. Negative for diarrhea, nausea and vomiting.  Genitourinary: Negative for dysuria.  Musculoskeletal: Negative for myalgias.  Skin: Negative for rash.  Neurological: Negative for dizziness and headaches.    Physical Exam Updated Vital Signs BP (!) 154/94   Pulse 79   Temp 98.2 F (36.8 C) (Oral)   Resp 18   LMP 10/14/2020 (Approximate)   SpO2 100%    Physical Exam Vitals and nursing note reviewed.  Constitutional:      General: She is not in acute distress.    Appearance: She is obese.     Comments: Pleasant well-appearing 45 year old.  Does not appear to be in any severe acute distress.  But does appear uncomfortable.  Able answer questions appropriately follow commands. No increased work of breathing. Speaking in full sentences.  HENT:     Head: Normocephalic and atraumatic.     Nose: Nose normal.  Eyes:     General: No scleral icterus. Cardiovascular:     Rate and Rhythm: Normal rate and regular rhythm.     Pulses: Normal pulses.     Heart sounds: Normal heart sounds.  Pulmonary:     Effort: Pulmonary effort is normal. No respiratory distress.     Breath sounds: No wheezing.  Abdominal:     Palpations: Abdomen is soft.     Tenderness: There is abdominal tenderness.     Comments: Mild right lower quadrant tenderness  with deep palpation.  No guarding or rebound.  Negative heel jar.  Abdomen is nonperitoneal.  It is soft and obese. No CVA tenderness.  Musculoskeletal:     Cervical back: Normal range of motion.     Right lower leg: No edema.     Left lower leg: No edema.  Skin:    General: Skin is warm and dry.     Capillary Refill: Capillary refill takes less than 2 seconds.  Neurological:     Mental Status: She is alert. Mental status is at baseline.  Psychiatric:        Mood and Affect: Mood normal.        Behavior: Behavior normal.     ED Results / Procedures / Treatments   Labs (all labs ordered are listed, but only abnormal results are displayed) Labs Reviewed  COMPREHENSIVE METABOLIC PANEL - Abnormal; Notable for the following components:      Result Value   Glucose, Bld 389 (*)    Creatinine, Ser 1.30 (*)    Total Protein 8.3 (*)    AST 11 (*)    GFR, Estimated 51 (*)    All other components within normal limits  CBC - Abnormal; Notable for the following components:   WBC 11.0 (*)    All other  components within normal limits  URINALYSIS, ROUTINE W REFLEX MICROSCOPIC - Abnormal; Notable for the following components:   APPearance HAZY (*)    Specific Gravity, Urine 1.033 (*)    Glucose, UA >=500 (*)    Hgb urine dipstick LARGE (*)    Protein, ur 100 (*)    Bacteria, UA RARE (*)    All other components within normal limits  LIPASE, BLOOD  I-STAT BETA HCG BLOOD, ED ([facility], [facility], [facility] ONLY)    EKG None  Radiology CT ABDOMEN PELVIS WO CONTRAST  Result Date: 10/20/2020 CLINICAL DATA:  Right lower quadrant pain for several weeks EXAM: CT ABDOMEN AND PELVIS WITHOUT CONTRAST TECHNIQUE: Multidetector CT imaging of the abdomen and pelvis was performed following the standard protocol without IV contrast. COMPARISON:  None. FINDINGS: Lower chest: No acute abnormality. Hepatobiliary: No focal liver abnormality is seen. No gallstones, gallbladder wall thickening, or biliary dilatation. Pancreas: Unremarkable. No pancreatic ductal dilatation or surrounding inflammatory changes. Spleen: Normal in size without focal abnormality. Adrenals/Urinary Tract: Adrenal glands are within normal limits. Kidneys demonstrate a normal appearance bilaterally. No renal calculi or obstructive changes are seen. Bladder is partially distended. Stomach/Bowel: The appendix is within normal limits. No inflammatory changes are seen. Colon shows no obstructive or inflammatory changes. Small bowel and stomach are within normal limits. Vascular/Lymphatic: No significant vascular findings are present. No enlarged abdominal or pelvic lymph nodes. Reproductive: Uterus and bilateral adnexa are unremarkable. Other: No abdominal wall hernia or abnormality. No abdominopelvic ascites. Musculoskeletal: No acute or significant osseous findings. IMPRESSION: No acute abnormality correspond with the patient's given clinical symptomatology is noted. Electronically Signed   By: Inez Catalina M.D.   On: 10/20/2020 18:08    Procedures Procedures    Medications Ordered in ED Medications  sodium chloride 0.9 % bolus 1,000 mL (0 mLs Intravenous Stopped 10/20/20 1904)  ondansetron (ZOFRAN) injection 4 mg (4 mg Intravenous Given 10/20/20 1652)  morphine 4 MG/ML injection 4 mg (4 mg Intravenous Given 10/20/20 1652)  insulin aspart (novoLOG) injection 8 Units (8 Units Subcutaneous Given 10/20/20 1652)    ED Course  I have reviewed the triage vital signs and the nursing notes.  Pertinent  labs & imaging results that were available during my care of the patient were reviewed by me and considered in my medical decision making (see chart for details).  Patient is a right lower quadrant abdominal pain for approximately 3 weeks seems to be intermittent come and go.  She appears very comfortable however endorses a severe abdominal pain.  Her vital signs are within normal limits she is not tachycardic or significantly hypertensive other than initial BP which is improved.  I provided patient with a dose of narcotic pain medicine and reassess she states that her pain has improved some but states it is still present.  Physical exam is notable for some mild right lower quadrant tenderness with deep palpation.  Patient has not peritonitic  CMP is elevated at 389 creatinine mildly elevated from her prior creatinine at 1.3 but not significantly elevated.  No other significant abnormalities on CMP.  Urinalysis with rare bacteria no leukocytes or nitrates there is some glucosuria and proteinuria which I discussed with the patient she will follow-up with her PCP to review.  Provide patient with a small dose of insulin and fluids given her hyperglycemia.  Lipase within normal-pancreatitis.  Notably patient has no anion gap and CO2 is within normal limits I doubt DKA.  Clinical Course as of 10/20/20 2140  Thu Oct 20, 2020  1820 CT abdomen pelvis without abnormality.  Notably the appendix was well visualized and has no evidence of inflammation or abnormality.  No  kidney stones present. [WF]    Clinical Course User Index [WF] Tedd Sias, [place]   MDM Rules/Calculators/A&P                          Patient reassessed at this time.  Has no further complaints.  All questions answered to the best my ability.  I viewed patient's imaging and she is understanding of need for close follow-up with PCP to recheck her blood sugars and titrate her antiglycemic medications appropriately.  Return precautions given, p.o. challenged and tolerated without difficulty.  Ambulatory at time of discharge.  Final Clinical Impression(s) / ED Diagnoses Final diagnoses:  Right lower quadrant abdominal pain    Rx / DC Orders ED Discharge Orders         Ordered    dicyclomine (BENTYL) 20 MG tablet  2 times daily        10/20/20 1832    ondansetron (ZOFRAN ODT) 4 MG disintegrating tablet  Every 8 hours PRN        10/20/20 1832           Tedd Sias, [place] 10/20/20 2141    Varney Biles, MD 10/21/20 1314

## 2020-10-20 NOTE — ED Notes (Signed)
An After Visit Summary was printed and given to the patient. Discharge instructions given and no further questions at this time.  

## 2020-10-21 ENCOUNTER — Other Ambulatory Visit: Payer: Self-pay | Admitting: Internal Medicine

## 2020-10-24 NOTE — Telephone Encounter (Signed)
Rx for review 

## 2020-10-26 DIAGNOSIS — R102 Pelvic and perineal pain: Secondary | ICD-10-CM | POA: Diagnosis not present

## 2020-10-26 DIAGNOSIS — N940 Mittelschmerz: Secondary | ICD-10-CM | POA: Diagnosis not present

## 2020-12-14 DIAGNOSIS — N920 Excessive and frequent menstruation with regular cycle: Secondary | ICD-10-CM | POA: Diagnosis not present

## 2020-12-14 DIAGNOSIS — Z01419 Encounter for gynecological examination (general) (routine) without abnormal findings: Secondary | ICD-10-CM | POA: Diagnosis not present

## 2020-12-14 DIAGNOSIS — Z1231 Encounter for screening mammogram for malignant neoplasm of breast: Secondary | ICD-10-CM | POA: Diagnosis not present

## 2020-12-29 ENCOUNTER — Emergency Department (HOSPITAL_BASED_OUTPATIENT_CLINIC_OR_DEPARTMENT_OTHER)
Admission: EM | Admit: 2020-12-29 | Discharge: 2020-12-29 | Disposition: A | Discharge status: Home / Self Care | Payer: Managed Care, Other (non HMO) | Attending: Emergency Medicine | Admitting: Emergency Medicine

## 2020-12-29 ENCOUNTER — Other Ambulatory Visit: Payer: Self-pay

## 2020-12-29 ENCOUNTER — Encounter (HOSPITAL_BASED_OUTPATIENT_CLINIC_OR_DEPARTMENT_OTHER): Payer: Self-pay | Admitting: *Deleted

## 2020-12-29 DIAGNOSIS — I10 Essential (primary) hypertension: Secondary | ICD-10-CM | POA: Insufficient documentation

## 2020-12-29 DIAGNOSIS — Z794 Long term (current) use of insulin: Secondary | ICD-10-CM | POA: Insufficient documentation

## 2020-12-29 DIAGNOSIS — R739 Hyperglycemia, unspecified: Secondary | ICD-10-CM

## 2020-12-29 DIAGNOSIS — Z79899 Other long term (current) drug therapy: Secondary | ICD-10-CM | POA: Insufficient documentation

## 2020-12-29 DIAGNOSIS — K610 Anal abscess: Secondary | ICD-10-CM | POA: Insufficient documentation

## 2020-12-29 DIAGNOSIS — E1165 Type 2 diabetes mellitus with hyperglycemia: Secondary | ICD-10-CM | POA: Insufficient documentation

## 2020-12-29 LAB — BASIC METABOLIC PANEL
Anion gap: 11 (ref 5–15)
BUN: 9 mg/dL (ref 6–20)
CO2: 20 mmol/L — ABNORMAL LOW (ref 22–32)
Calcium: 8.8 mg/dL — ABNORMAL LOW (ref 8.9–10.3)
Chloride: 104 mmol/L (ref 98–111)
Creatinine, Ser: 1.1 mg/dL — ABNORMAL HIGH (ref 0.44–1.00)
GFR, Estimated: >60 mL/min (ref >60)
Glucose, Bld: 345 mg/dL — ABNORMAL HIGH (ref 70–99)
Potassium: 3.5 mmol/L (ref 3.5–5.1)
Sodium: 135 mmol/L (ref 135–145)

## 2020-12-29 LAB — CBG MONITORING, ED
Glucose-Capillary: 363 mg/dL — ABNORMAL HIGH (ref 70–99)
Glucose-Capillary: 297 mg/dL — ABNORMAL HIGH (ref 70–99)

## 2020-12-29 LAB — CBC
HCT: 34.5 % — ABNORMAL LOW (ref 36.0–46.0)
Hemoglobin: 11.4 g/dL — ABNORMAL LOW (ref 12.0–15.0)
MCH: 28.5 pg (ref 26.0–34.0)
MCHC: 33 g/dL (ref 30.0–36.0)
MCV: 86.3 fL (ref 80.0–100.0)
Platelets: 153 10*3/uL (ref 150–400)
RBC: 4 MIL/uL (ref 3.87–5.11)
RDW: 12.1 % (ref 11.5–15.5)
WBC: 10.1 10*3/uL (ref 4.0–10.5)
nRBC: 0 % (ref 0.0–0.2)

## 2020-12-29 MED ORDER — AMOXICILLIN-POT CLAVULANATE 875-125 MG PO TABS: 1.0000 | ORAL_TABLET | Freq: Two times a day (BID) | ORAL | 0 refills | Status: DC

## 2020-12-29 MED ORDER — HYDROCODONE-ACETAMINOPHEN 5-325 MG PO TABS: 1.0000 | ORAL_TABLET | Freq: Four times a day (QID) | ORAL | 0 refills | Status: DC | PRN

## 2020-12-29 MED ORDER — LIDOCAINE-EPINEPHRINE (PF) 2 %-1:200000 IJ SOLN
10.0000 mL | Freq: Once | INTRAMUSCULAR | Status: AC
  Administered 2020-12-29: 10 mL
  Filled 2020-12-29: qty 20

## 2020-12-29 MED ORDER — HYDROMORPHONE HCL 1 MG/ML IJ SOLN
0.5000 mg | Freq: Once | INTRAMUSCULAR | Status: AC
  Administered 2020-12-29: 0.5 mg via INTRAVENOUS
  Filled 2020-12-29: qty 1

## 2020-12-29 MED ORDER — SODIUM CHLORIDE 0.9 % IV BOLUS
1000.0000 mL | Freq: Once | INTRAVENOUS | Status: AC
Start: 2020-12-29 — End: 2020-12-29
  Administered 2020-12-29: 1000 mL via INTRAVENOUS

## 2020-12-29 NOTE — ED Provider Notes (Signed)
[facility] EMERGENCY DEPT Provider Note   CSN: [id number] Arrival date & time: 12/29/20  1839     History Chief Complaint  Patient presents with  . Polinodal cyst    Tricia Soto is a 45 y.o. female.  HPI Patient presents with pain in her buttock area.  States she has another abscess.  No fevers.  States her sugars been running a little high.  States she has not checked it felt as if it goes high.  Has had previous buttock abscess.  States it hurts to sit.  Hurts a little to have a bowel movement also.  States that her husband put a needle into it and had some purulent drainage.    Past Medical History:  Diagnosis Date  . Chronic headaches   . Diabetes mellitus without complication ([place])   . GERD (gastroesophageal reflux disease)   . Hypertension   . Meningioma [facility])     Patient Active Problem List   Diagnosis Date Noted  . Meningioma ([place]) 06/23/2019  . Brain tumor ([place]) 06/23/2019  . Acute pain of right knee 12/30/2017  . Hypertension 08/20/2017  . 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 Insomnia 02/18/2012  . Migraine headache 10/15/2011  . Insulin dependent type 2 diabetes mellitus ([place]) 10/15/2011  . 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 BMI 37.0-37.9, adult 10/15/2011  . Type II or unspecified type diabetes mellitus without mention of complication, not stated as uncontrolled 09/22/2009  . S/P partial thyroidectomy 09/22/2009  . 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 APPLICATION OF CRANIAL NAVIGATION N/A 06/23/2019   Procedure: APPLICATION OF CRANIAL NAVIGATION;  Surgeon: Judith Part, MD;  Location: [place];  Service: Neurosurgery;  Laterality: N/A;  . CRANIOTOMY Left 06/23/2019   Procedure: Left craniotomy for tumor resection with brainlab/facial nerve monitoring;  Surgeon: Judith Part, MD;   Location: [place];  Service: Neurosurgery;  Laterality: Left;  . THYROIDECTOMY, PARTIAL    . TUBAL LIGATION     1997  . tubal ligation reversal  2013     OB History   No obstetric history on file.     History reviewed. No pertinent family history.  Social History   Tobacco Use  . Smoking status: Never Smoker  . Smokeless tobacco: Never Used  Vaping Use  . Vaping Use: Never used  Substance Use Topics  . Alcohol use: Not Currently  . Drug use: Never    Home Medications Prior to Admission medications   Medication Sig Start Date End Date Taking? Authorizing Provider  amoxicillin-clavulanate (AUGMENTIN) 875-125 MG tablet Take 1 tablet by mouth every 12 (twelve) hours. 12/29/20  Yes Davonna Belling, MD  HYDROcodone-acetaminophen (NORCO/VICODIN) 5-325 MG tablet Take 1-2 tablets by mouth every 6 (six) hours as needed. 12/29/20  Yes Davonna Belling, MD  amitriptyline (ELAVIL) 50 MG tablet TAKE 1 TABLET BY MOUTH AT BEDTIME 10/24/20   Vaslow, Acey Lav, MD  atorvastatin (LIPITOR) 10 MG tablet Take 10 mg by mouth at bedtime. 10/30/19   [provider]  butalbital-acetaminophen-caffeine (FIORICET) 50-325-40 MG tablet Take 1 tablet by mouth every 6 (six) hours as needed. For migraine 07/25/19   [provider]  Continuous Blood Gluc Transmit (DEXCOM G6 TRANSMITTER) MISC USE 1 DEVICE EVERY 3 MONTHS 10/11/19   [provider]  dicyclomine (BENTYL) 20 MG tablet Take 1 tablet (20 mg total) by mouth  2 (two) times daily. 10/20/20   Tedd Sias, PA  hydrOXYzine (ATARAX/VISTARIL) 50 MG tablet Take 50 mg by mouth 3 (three) times daily as needed for anxiety.     [provider]  hydrOXYzine (VISTARIL) 50 MG capsule Take 50 mg by mouth at bedtime. 10/13/20   [provider]  insulin lispro (HUMALOG) 100 UNIT/ML KwikPen Inject 4-8 Units into the skin 3 (three) times daily as needed (blood sugar 150 or above).    [provider]  LANTUS SOLOSTAR 100  UNIT/ML Solostar Pen Inject 12 Units into the skin at bedtime. 03/23/20   [provider]  losartan (COZAAR) 25 MG tablet Take 25 mg by mouth every evening. 04/13/19   [provider]  ondansetron (ZOFRAN ODT) 4 MG disintegrating tablet Take 1 tablet (4 mg total) by mouth every 8 (eight) hours as needed for nausea or vomiting. 10/20/20   Fondaw, Wylder S, PA  OZEMPIC, 1 MG/DOSE, 4 MG/3ML SOPN Inject 1 mg into the skin once a week. Take on Tues 10/12/20   [provider]    Allergies    Lisinopril and Tape  Review of Systems   Review of Systems  Constitutional: Negative for appetite change.  HENT: Negative for congestion.   Respiratory: Negative for cough.   Gastrointestinal: Negative for abdominal pain, anal bleeding and rectal pain.  Genitourinary: Negative for flank pain.  Musculoskeletal: Negative for back pain.  Skin: Negative for rash.       Perianal abscess.  Neurological: Negative for weakness.  Psychiatric/Behavioral: Negative for confusion.    Physical Exam Updated Vital Signs BP 128/79 (BP Location: Left Arm)   Pulse 87   Temp 98.8 F (37.1 C) (Oral)   Resp 16   Ht 5\' 8"  (1.727 m)   Wt 99.1 kg   SpO2 100%   BMI 33.22 kg/m   Physical Exam Vitals and nursing note reviewed.  Cardiovascular:     Rate and Rhythm: Normal rate.  Chest:     Chest wall: No tenderness.  Abdominal:     Tenderness: There is no abdominal tenderness.  Genitourinary:    Comments: Tenderness with induration and fluctuance on left buttock near perianal region.  Approximately 5 cm across.  There is some purulent drainage in the middle.  It does appear that it likely stops prior to the sphincter but does have tenderness in the area. Musculoskeletal:     Cervical back: Neck supple.  Skin:    Capillary Refill: Capillary refill takes less than 2 seconds.  Neurological:     Mental Status: She is alert and oriented to person, place, and time.     ED Results / Procedures  / Treatments   Labs (all labs ordered are listed, but only abnormal results are displayed) Labs Reviewed  BASIC METABOLIC PANEL - Abnormal; Notable for the following components:      Result Value   CO2 20 (*)    Glucose, Bld 345 (*)    Creatinine, Ser 1.10 (*)    Calcium 8.8 (*)    All other components within normal limits  CBC - Abnormal; Notable for the following components:   Hemoglobin 11.4 (*)    HCT 34.5 (*)    All other components within normal limits  CBG MONITORING, ED - Abnormal; Notable for the following components:   Glucose-Capillary 363 (*)    All other components within normal limits  CBG MONITORING, ED - Abnormal; Notable for the following components:   Glucose-Capillary 297 (*)  All other components within normal limits    EKG None  Radiology No results found.  Procedures .Marland KitchenIncision and Drainage  Date/Time: 12/29/2020 8:17 PM Performed by: Davonna Belling, MD Authorized by: Davonna Belling, MD   Consent:    Consent obtained:  Verbal   Consent given by:  Patient   Risks, benefits, and alternatives were discussed: yes     Risks discussed:  Bleeding, incomplete drainage, pain and damage to other organs   Alternatives discussed:  No treatment and delayed treatment Universal protocol:    Procedure explained and questions answered to patient or proxy's satisfaction: yes   Location:    Type:  Abscess   Size:  5 cm   Location:  Anogenital   Anogenital location:  Perianal Pre-procedure details:    Skin preparation:  Chlorhexidine Sedation:    Sedation type:  None Anesthesia:    Anesthesia method:  Local infiltration   Local anesthetic:  Lidocaine 2% WITH epi Procedure type:    Complexity:  Simple Procedure details:    Ultrasound guidance: no     Needle aspiration: no     Incision types:  Elliptical   Incision depth:  Subcutaneous   Wound management:  Irrigated with saline and probed and deloculated   Drainage:  Purulent   Drainage amount:   Moderate   Wound treatment:  Wound left open   Packing materials:  None Post-procedure details:    Procedure completion:  Tolerated well, no immediate complications     Medications Ordered in ED Medications  lidocaine-EPINEPHrine (XYLOCAINE W/EPI) 2 %-1:200000 (PF) injection 10 mL (10 mLs Infiltration Given 12/29/20 1943)  sodium chloride 0.9 % bolus 1,000 mL (0 mLs Intravenous Stopped 12/29/20 2106)  HYDROmorphone (DILAUDID) injection 0.5 mg (0.5 mg Intravenous Given 12/29/20 1944)    ED Course  I have reviewed the triage vital signs and the nursing notes.  Pertinent labs & imaging results that were available during my care of the patient were reviewed by me and considered in my medical decision making (see chart for details).    MDM Rules/Calculators/A&P                          Patient with buttock abscess.  Appears to reach down towards the perianal area.  I do not think it goes all the way down to the sphincter but with the location and the tenderness I feels the patient would benefit from surgical follow-up instead of just PCP follow-up.  Will give antibiotics to help.  Incision and drainage has been done but still fair amount of induration.  Patient does have hyperglycemia.  Improved with fluids.  Can treat with insulin at home.  Instructed patient that with infection sugars more likely to go high or low.  Patient has endocrinology follow-up already planned.  Will discharge home.  Does not appear septic Final Clinical Impression(s) / ED Diagnoses Final diagnoses:  Hyperglycemia  Perianal abscess    Rx / DC Orders ED Discharge Orders         Ordered    amoxicillin-clavulanate (AUGMENTIN) 875-125 MG tablet  Every 12 hours        12/29/20 2110    HYDROcodone-acetaminophen (NORCO/VICODIN) 5-325 MG tablet  Every 6 hours PRN        12/29/20 2111           Davonna Belling, MD 12/29/20 2114

## 2020-12-29 NOTE — ED Triage Notes (Signed)
Boil to buttocks for 1 week.

## 2021-01-11 DIAGNOSIS — E119 Type 2 diabetes mellitus without complications: Secondary | ICD-10-CM | POA: Diagnosis not present

## 2021-01-11 DIAGNOSIS — H524 Presbyopia: Secondary | ICD-10-CM | POA: Diagnosis not present

## 2021-01-11 DIAGNOSIS — E089 Diabetes mellitus due to underlying condition without complications: Secondary | ICD-10-CM | POA: Diagnosis not present

## 2021-01-12 ENCOUNTER — Telehealth: Payer: Self-pay | Admitting: Internal Medicine

## 2021-01-12 NOTE — Telephone Encounter (Signed)
Scheduled appt per 5/31 sch msg. Called pt, no answer. Left msg with appt date and time.

## 2021-01-30 DIAGNOSIS — E1165 Type 2 diabetes mellitus with hyperglycemia: Secondary | ICD-10-CM | POA: Diagnosis not present

## 2021-01-30 DIAGNOSIS — Z8639 Personal history of other endocrine, nutritional and metabolic disease: Secondary | ICD-10-CM | POA: Diagnosis not present

## 2021-01-30 DIAGNOSIS — E890 Postprocedural hypothyroidism: Secondary | ICD-10-CM | POA: Diagnosis not present

## 2021-01-30 DIAGNOSIS — E119 Type 2 diabetes mellitus without complications: Secondary | ICD-10-CM | POA: Diagnosis not present

## 2021-02-01 ENCOUNTER — Other Ambulatory Visit: Payer: Self-pay | Admitting: *Deleted

## 2021-02-15 DIAGNOSIS — E119 Type 2 diabetes mellitus without complications: Secondary | ICD-10-CM | POA: Diagnosis not present

## 2021-02-15 DIAGNOSIS — Z8639 Personal history of other endocrine, nutritional and metabolic disease: Secondary | ICD-10-CM | POA: Diagnosis not present

## 2021-02-15 DIAGNOSIS — E890 Postprocedural hypothyroidism: Secondary | ICD-10-CM | POA: Diagnosis not present

## 2021-02-17 ENCOUNTER — Ambulatory Visit
Admission: RE | Admit: 2021-02-17 | Discharge: 2021-02-17 | Disposition: A | Discharge status: Home / Self Care | Payer: BC Managed Care – PPO | Source: Ambulatory Visit | Attending: Internal Medicine | Admitting: Internal Medicine

## 2021-02-17 DIAGNOSIS — D329 Benign neoplasm of meninges, unspecified: Secondary | ICD-10-CM | POA: Diagnosis not present

## 2021-02-17 DIAGNOSIS — I613 Nontraumatic intracerebral hemorrhage in brain stem: Secondary | ICD-10-CM | POA: Diagnosis not present

## 2021-02-17 MED ORDER — GADOBENATE DIMEGLUMINE 529 MG/ML IV SOLN
20.0000 mL | Freq: Once | INTRAVENOUS | Status: AC | PRN
  Administered 2021-02-17: 20 mL via INTRAVENOUS

## 2021-02-20 ENCOUNTER — Inpatient Hospital Stay: Payer: BC Managed Care – PPO | Attending: Internal Medicine | Admitting: Internal Medicine

## 2021-02-20 ENCOUNTER — Inpatient Hospital Stay: Payer: BC Managed Care – PPO | Attending: Internal Medicine

## 2021-02-20 ENCOUNTER — Other Ambulatory Visit: Payer: Self-pay

## 2021-02-20 VITALS — BP 151/93 | HR 84 | Temp 99.0°F | Resp 18 | Wt 223.0 lb

## 2021-02-20 DIAGNOSIS — N920 Excessive and frequent menstruation with regular cycle: Secondary | ICD-10-CM | POA: Diagnosis not present

## 2021-02-20 DIAGNOSIS — D329 Benign neoplasm of meninges, unspecified: Secondary | ICD-10-CM | POA: Insufficient documentation

## 2021-02-20 DIAGNOSIS — Z923 Personal history of irradiation: Secondary | ICD-10-CM | POA: Insufficient documentation

## 2021-02-20 DIAGNOSIS — N946 Dysmenorrhea, unspecified: Secondary | ICD-10-CM | POA: Diagnosis not present

## 2021-02-20 DIAGNOSIS — N852 Hypertrophy of uterus: Secondary | ICD-10-CM | POA: Diagnosis not present

## 2021-02-20 DIAGNOSIS — D259 Leiomyoma of uterus, unspecified: Secondary | ICD-10-CM | POA: Diagnosis not present

## 2021-02-20 MED ORDER — AMITRIPTYLINE HCL 50 MG PO TABS: 50.0000 mg | ORAL_TABLET | Freq: Every day | ORAL | 5 refills | Status: DC

## 2021-02-20 NOTE — Progress Notes (Signed)
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 Zada Finders 07/30/19: SRS to residual tumor Tricia Soto)  Interval History:  Tricia Soto presents today for follow up after recent MRI brain.  Vision has been stable.  Migraines and other headaches have improved dramatically.  She still complains of "funny feelings at times" around surgical site.   H+P (08/03/19) Patient presents today for follow up after having completed radiation therapy last week.  She initially began experiencing left sided head/facial pain episodes in September 2020, different in nature from her established migraine headache syndrome.  After no relief from traditional pain medications, she obtained as CT (and later MRI) which demonstrated mass abutting the clivus and brainstem.  She went for resection with Dr. Zada Finders on 06/23/19, after which she began to experience double vision.  Double vision, which is persistent to today, is described as "seeing objects side by side when looking straight ahead, worse to the left".  In addition she has experienced some facial numbness, although this is not dense or consistent.  Finally, she feels her left ear hearing was poor following surgery, but this has improved considerably, nearly to baseline.  No issues with radiation.  Currently she is concerned about her ability to drive and return to work given her visual impairments.  Medications: Current Outpatient Medications on File Prior to Visit  Medication Sig Dispense Refill   amitriptyline (ELAVIL) 50 MG tablet TAKE 1 TABLET BY MOUTH AT  BEDTIME 30 tablet 0   amoxicillin-clavulanate (AUGMENTIN) 875-125 MG tablet Take 1 tablet by mouth every 12 (twelve) hours. 20 tablet 0   atorvastatin (LIPITOR) 10 MG tablet Take 10 mg by mouth at bedtime.     butalbital-acetaminophen-caffeine (FIORICET) 50-325-40 MG tablet Take 1 tablet by mouth every 6 (six) hours as needed. For migraine     Continuous Blood Gluc Transmit (DEXCOM G6 TRANSMITTER) MISC USE 1 DEVICE EVERY 3 MONTHS     dicyclomine (BENTYL) 20 MG tablet Take 1 tablet (20 mg total) by mouth 2 (two) times daily. 20 tablet 0   HYDROcodone-acetaminophen (NORCO/VICODIN) 5-325 MG tablet Take 1-2 tablets by mouth every 6 (six) hours as needed. 6 tablet 0   hydrOXYzine (ATARAX/VISTARIL) 50 MG tablet Take 50 mg by mouth 3 (three) times daily as needed for anxiety.      hydrOXYzine (VISTARIL) 50 MG capsule Take 50 mg by mouth at bedtime.     insulin lispro (HUMALOG) 100 UNIT/ML KwikPen Inject 4-8 Units into the skin 3 (three) times daily as needed (blood sugar 150 or above).     LANTUS SOLOSTAR 100 UNIT/ML Solostar Pen Inject 12 Units into the skin at bedtime.     losartan (COZAAR) 25 MG tablet Take 25 mg by mouth every evening.     ondansetron (ZOFRAN ODT) 4 MG disintegrating tablet Take 1 tablet (4 mg total) by mouth every 8 (eight) hours as needed for nausea or vomiting. 20 tablet 0   OZEMPIC, 1 MG/DOSE, 4 MG/3ML SOPN Inject 1 mg into the skin once a week. Take on Tues     No current facility-administered medications on file prior to visit.  Allergies:  Allergies  Allergen Reactions   Lisinopril Cough and Other (See Comments)   Tape Other (See Comments)    Electrodes used for EKG. Leaves "marks" on the skin. Patient told to be careful using bandages. Ok to use Paper Tape.    Past Medical History:  Past Medical History:  Diagnosis Date   Chronic headaches    Diabetes mellitus without complication ([facility])    GERD (gastroesophageal reflux disease)    Hypertension    Meningioma  (Tricia Soto)    Past Surgical History:  Past Surgical History:  Procedure Laterality Date   APPLICATION OF CRANIAL NAVIGATION N/A 06/23/2019   Procedure: APPLICATION OF CRANIAL NAVIGATION;  Surgeon: Judith Part, MD;  Location: [facility];  Service: Neurosurgery;  Laterality: N/A;   CRANIOTOMY Left 06/23/2019   Procedure: Left craniotomy for tumor resection with brainlab/facial nerve monitoring;  Surgeon: Judith Part, MD;  Location: [facility];  Service: Neurosurgery;  Laterality: Left;   THYROIDECTOMY, PARTIAL     TUBAL LIGATION     1997   tubal ligation reversal  2013   Social History:  Social History   Socioeconomic History   Marital status: Married    Spouse name: Not on file   Number of children: Not on file   Years of education: Not on file   Highest education level: Not on file  Occupational History   Not on file  Tobacco Use   Smoking status: Never   Smokeless tobacco: Never  Vaping Use   Vaping Use: Never used  Substance and Sexual Activity   Alcohol use: Not Currently   Drug use: Never   Sexual activity: Not on file  Other Topics Concern   Not on file  Social History Narrative   Not on file   Social Determinants of Health   Financial Resource Strain: Not on file  Food Insecurity: Not on file  Transportation Needs: Not on file  Physical Activity: Not on file  Stress: Not on file  Social Connections: Not on file  Intimate Partner Violence: Not on file   Family History: No family history on file.  Review of Systems: Constitutional: Denies fevers, chills or abnormal weight loss Eyes: Denies blurriness of vision Ears, nose, mouth, throat, and face: Denies mucositis or sore throat Respiratory: Denies cough, dyspnea or wheezes Cardiovascular: Denies palpitation, chest discomfort or lower extremity swelling Gastrointestinal:  Denies nausea, constipation, diarrhea GU: Denies dysuria or incontinence Skin: Denies abnormal skin rashes Neurological: Per  HPI Musculoskeletal: Denies joint pain, back or neck discomfort. No decrease in ROM Behavioral/Psych: Denies anxiety, disturbance in thought content, and mood instability  Physical Exam: Vitals:   02/20/21 1129  BP: (!) 151/93  Pulse: 84  Resp: 18  Temp: 99 F (37.2 C)  SpO2: 99%   KPS: 90. General: Alert, cooperative, pleasant, in no acute distress Head: Normal EENT: No conjunctival injection or scleral icterus. Oral mucosa moist Lungs: Resp effort normal Cardiac: Regular rate and rhythm Abdomen:  non-distended abdomen Skin: No rashes cyanosis or petechiae. Extremities: No clubbing or edema  Neurologic Exam: Mental Status: Awake, alert, attentive to examiner. Oriented to self and environment. Language is fluent with intact comprehension.  Cranial Nerves: Visual acuity is grossly normal. Visual fields are full. Left CNVI paresis. No ptosis. Face is symmetric. Motor: Tone and bulk are normal. Power is full in both arms and legs. Reflexes are symmetric, no pathologic reflexes present. Sensory: Intact to light touch and temperature Gait: Normal   Labs: I have  reviewed the data as listed    Component Value Date/Time   NA 135 12/29/2020 1946   K 3.5 12/29/2020 1946   CL 104 12/29/2020 1946   CO2 20 (L) 12/29/2020 1946   GLUCOSE 345 (H) 12/29/2020 1946   BUN 9 12/29/2020 1946   CREATININE 1.10 (H) 12/29/2020 1946   CALCIUM 8.8 (L) 12/29/2020 1946   PROT 8.3 (H) 10/20/2020 1228   ALBUMIN 3.8 10/20/2020 1228   AST 11 (L) 10/20/2020 1228   ALT 11 10/20/2020 1228   ALKPHOS 102 10/20/2020 1228   BILITOT 1.1 10/20/2020 1228   GFRNONAA >60 12/29/2020 1946   GFRAA >60 06/23/2019 1536   Lab Results  Component Value Date   WBC 10.1 12/29/2020   NEUTROABS 5.2 04/22/2019   HGB 11.4 (L) 12/29/2020   HCT 34.5 (L) 12/29/2020   MCV 86.3 12/29/2020   PLT 153 12/29/2020    Imaging:  [facility] Clinician Interpretation: I have personally reviewed the CNS images as listed.  My  interpretation, in the context of the patient's clinical presentation, is stable disease  MR BRAIN W WO CONTRAST  Result Date: 02/19/2021 CLINICAL DATA:  Meningioma follow-up EXAM: MRI HEAD WITHOUT AND WITH CONTRAST TECHNIQUE: Multiplanar, multiecho pulse sequences of the brain and surrounding structures were obtained without and with intravenous contrast. CONTRAST:  60mL MULTIHANCE GADOBENATE DIMEGLUMINE 529 MG/ML IV SOLN COMPARISON:  05/09/2020 FINDINGS: Brain: No acute infarct, mass effect or extra-axial collection. Left pontine chronic microhemorrhage, unchanged. No acute hemorrhage. Normal white matter signal, parenchymal volume and CSF spaces. Meningioma just anterior to the left midbrain, at the left tentorial insertion within the left perimesencephalic and prepontine cistern, is unchanged in size measuring 11 x 8 x 5 mm. Vascular: Major flow voids are preserved. Skull and upper cervical spine: Remote left retromastoid craniotomy. Sinuses/Orbits:No paranasal sinus fluid levels or advanced mucosal thickening. No mastoid or middle ear effusion. Normal orbits. IMPRESSION: 1. Unchanged size of 11 x 8 x 5 mm meningioma along the left anterior insertion of the tentorium cerebelli. Electronically Signed   By: Ulyses Jarred M.D.   On: 02/19/2021 15:54      Assessment/Plan Meningioma ([place]) [D32.9]  We appreciate the opportunity to participate in the care of Leith.  She is clinically and radiographically stable.  Ok to continue Elavil for headache prevention, 50mg  HS.  Doing much better in new(er) job, stress is better managed.  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   We ask that Rise Mu return to clinic in 12 months following next brain MRI, or sooner as needed.  All questions were answered. The patient knows to call the clinic with any problems, questions or concerns. No barriers to learning were detected.  I have spent a total of 30  minutes of face-to-face and non-face-to-face time, excluding clinical staff time, preparing to see patient, ordering tests and/or medications, counseling the patient, and independently interpreting results and communicating results to the patient/family/caregiver    Ventura Sellers, MD Medical Director of Neuro-Oncology [facility] at [place] 02/20/21 11:42 AM

## 2021-02-21 ENCOUNTER — Other Ambulatory Visit: Payer: Self-pay | Admitting: Radiation Therapy

## 2021-02-24 ENCOUNTER — Telehealth: Payer: Self-pay | Admitting: Internal Medicine

## 2021-02-24 NOTE — Telephone Encounter (Signed)
Scheduled per 07/14 los, patient has been called and notified. 

## 2021-03-09 ENCOUNTER — Other Ambulatory Visit: Payer: Self-pay | Admitting: Obstetrics and Gynecology

## 2021-03-09 DIAGNOSIS — N852 Hypertrophy of uterus: Secondary | ICD-10-CM | POA: Diagnosis not present

## 2021-03-13 ENCOUNTER — Telehealth: Payer: Self-pay | Admitting: *Deleted

## 2021-03-13 NOTE — Telephone Encounter (Signed)
Faxed release for anesthesia clearance to [phone]

## 2021-03-17 ENCOUNTER — Other Ambulatory Visit: Payer: Self-pay | Admitting: Obstetrics and Gynecology

## 2021-03-17 DIAGNOSIS — N920 Excessive and frequent menstruation with regular cycle: Secondary | ICD-10-CM | POA: Diagnosis not present

## 2021-03-17 DIAGNOSIS — N852 Hypertrophy of uterus: Secondary | ICD-10-CM | POA: Diagnosis not present

## 2021-03-17 DIAGNOSIS — N840 Polyp of corpus uteri: Secondary | ICD-10-CM | POA: Diagnosis not present

## 2021-03-29 DIAGNOSIS — N920 Excessive and frequent menstruation with regular cycle: Secondary | ICD-10-CM | POA: Diagnosis not present

## 2021-05-03 DIAGNOSIS — E1129 Type 2 diabetes mellitus with other diabetic kidney complication: Secondary | ICD-10-CM | POA: Diagnosis not present

## 2021-05-03 DIAGNOSIS — E119 Type 2 diabetes mellitus without complications: Secondary | ICD-10-CM | POA: Diagnosis not present

## 2021-05-03 DIAGNOSIS — E890 Postprocedural hypothyroidism: Secondary | ICD-10-CM | POA: Diagnosis not present

## 2021-05-03 DIAGNOSIS — Z8639 Personal history of other endocrine, nutritional and metabolic disease: Secondary | ICD-10-CM | POA: Diagnosis not present

## 2021-05-10 DIAGNOSIS — N939 Abnormal uterine and vaginal bleeding, unspecified: Secondary | ICD-10-CM | POA: Diagnosis not present

## 2021-05-16 DIAGNOSIS — E1129 Type 2 diabetes mellitus with other diabetic kidney complication: Secondary | ICD-10-CM | POA: Diagnosis not present

## 2021-06-15 DIAGNOSIS — E1129 Type 2 diabetes mellitus with other diabetic kidney complication: Secondary | ICD-10-CM | POA: Diagnosis not present

## 2021-07-03 DIAGNOSIS — E1165 Type 2 diabetes mellitus with hyperglycemia: Secondary | ICD-10-CM | POA: Diagnosis not present

## 2021-07-03 DIAGNOSIS — Z8639 Personal history of other endocrine, nutritional and metabolic disease: Secondary | ICD-10-CM | POA: Diagnosis not present

## 2021-07-03 DIAGNOSIS — E1129 Type 2 diabetes mellitus with other diabetic kidney complication: Secondary | ICD-10-CM | POA: Diagnosis not present

## 2021-07-03 DIAGNOSIS — E119 Type 2 diabetes mellitus without complications: Secondary | ICD-10-CM | POA: Diagnosis not present

## 2021-07-03 DIAGNOSIS — E890 Postprocedural hypothyroidism: Secondary | ICD-10-CM | POA: Diagnosis not present

## 2021-07-15 DIAGNOSIS — E1129 Type 2 diabetes mellitus with other diabetic kidney complication: Secondary | ICD-10-CM | POA: Diagnosis not present

## 2021-08-03 DIAGNOSIS — E119 Type 2 diabetes mellitus without complications: Secondary | ICD-10-CM | POA: Diagnosis not present

## 2021-08-03 DIAGNOSIS — E041 Nontoxic single thyroid nodule: Secondary | ICD-10-CM | POA: Diagnosis not present

## 2021-08-03 DIAGNOSIS — Z0000 Encounter for general adult medical examination without abnormal findings: Secondary | ICD-10-CM | POA: Diagnosis not present

## 2021-08-08 DIAGNOSIS — Z0000 Encounter for general adult medical examination without abnormal findings: Secondary | ICD-10-CM | POA: Diagnosis not present

## 2021-08-08 DIAGNOSIS — E041 Nontoxic single thyroid nodule: Secondary | ICD-10-CM | POA: Diagnosis not present

## 2021-08-08 DIAGNOSIS — G4700 Insomnia, unspecified: Secondary | ICD-10-CM | POA: Diagnosis not present

## 2021-08-22 DIAGNOSIS — E1129 Type 2 diabetes mellitus with other diabetic kidney complication: Secondary | ICD-10-CM | POA: Diagnosis not present

## 2021-08-29 DIAGNOSIS — I10 Essential (primary) hypertension: Secondary | ICD-10-CM | POA: Diagnosis not present

## 2021-08-29 DIAGNOSIS — R200 Anesthesia of skin: Secondary | ICD-10-CM | POA: Diagnosis not present

## 2021-08-29 DIAGNOSIS — M5441 Lumbago with sciatica, right side: Secondary | ICD-10-CM | POA: Diagnosis not present

## 2021-09-12 DIAGNOSIS — L282 Other prurigo: Secondary | ICD-10-CM | POA: Diagnosis not present

## 2021-09-12 DIAGNOSIS — I10 Essential (primary) hypertension: Secondary | ICD-10-CM | POA: Diagnosis not present

## 2021-09-21 DIAGNOSIS — E1129 Type 2 diabetes mellitus with other diabetic kidney complication: Secondary | ICD-10-CM | POA: Diagnosis not present

## 2021-10-19 ENCOUNTER — Other Ambulatory Visit: Payer: Self-pay | Admitting: Internal Medicine

## 2021-10-19 DIAGNOSIS — E041 Nontoxic single thyroid nodule: Secondary | ICD-10-CM

## 2021-10-19 DIAGNOSIS — E890 Postprocedural hypothyroidism: Secondary | ICD-10-CM

## 2021-10-21 DIAGNOSIS — E1129 Type 2 diabetes mellitus with other diabetic kidney complication: Secondary | ICD-10-CM | POA: Diagnosis not present

## 2021-11-06 ENCOUNTER — Ambulatory Visit
Admission: RE | Admit: 2021-11-06 | Discharge: 2021-11-06 | Disposition: A | Discharge status: Home / Self Care | Payer: BC Managed Care – PPO | Source: Ambulatory Visit | Attending: Internal Medicine | Admitting: Internal Medicine

## 2021-11-06 DIAGNOSIS — E890 Postprocedural hypothyroidism: Secondary | ICD-10-CM

## 2021-11-06 DIAGNOSIS — E041 Nontoxic single thyroid nodule: Secondary | ICD-10-CM

## 2021-11-28 DIAGNOSIS — E890 Postprocedural hypothyroidism: Secondary | ICD-10-CM | POA: Diagnosis not present

## 2021-11-28 DIAGNOSIS — E1129 Type 2 diabetes mellitus with other diabetic kidney complication: Secondary | ICD-10-CM | POA: Diagnosis not present

## 2021-11-28 DIAGNOSIS — Z8639 Personal history of other endocrine, nutritional and metabolic disease: Secondary | ICD-10-CM | POA: Diagnosis not present

## 2021-11-28 DIAGNOSIS — E119 Type 2 diabetes mellitus without complications: Secondary | ICD-10-CM | POA: Diagnosis not present

## 2021-11-29 DIAGNOSIS — E1129 Type 2 diabetes mellitus with other diabetic kidney complication: Secondary | ICD-10-CM | POA: Diagnosis not present

## 2021-12-18 ENCOUNTER — Emergency Department (HOSPITAL_BASED_OUTPATIENT_CLINIC_OR_DEPARTMENT_OTHER): Payer: BC Managed Care – PPO

## 2021-12-18 ENCOUNTER — Other Ambulatory Visit: Payer: Self-pay

## 2021-12-18 ENCOUNTER — Encounter (HOSPITAL_BASED_OUTPATIENT_CLINIC_OR_DEPARTMENT_OTHER): Payer: Self-pay | Admitting: Emergency Medicine

## 2021-12-18 ENCOUNTER — Emergency Department (HOSPITAL_BASED_OUTPATIENT_CLINIC_OR_DEPARTMENT_OTHER)
Admission: EM | Admit: 2021-12-18 | Discharge: 2021-12-18 | Disposition: A | Discharge status: Home / Self Care | Payer: BC Managed Care – PPO | Attending: Emergency Medicine | Admitting: Emergency Medicine

## 2021-12-18 DIAGNOSIS — M5459 Other low back pain: Secondary | ICD-10-CM | POA: Diagnosis not present

## 2021-12-18 DIAGNOSIS — N390 Urinary tract infection, site not specified: Secondary | ICD-10-CM | POA: Insufficient documentation

## 2021-12-18 DIAGNOSIS — M545 Low back pain, unspecified: Secondary | ICD-10-CM | POA: Diagnosis not present

## 2021-12-18 DIAGNOSIS — N200 Calculus of kidney: Secondary | ICD-10-CM | POA: Diagnosis not present

## 2021-12-18 LAB — URINALYSIS, ROUTINE W REFLEX MICROSCOPIC
Bilirubin Urine: NEGATIVE
Glucose, UA: 250 mg/dL — AB
Ketones, ur: NEGATIVE mg/dL
Nitrite: NEGATIVE
Protein, ur: >300 mg/dL — AB
Specific Gravity, Urine: 1.034 — ABNORMAL HIGH (ref 1.005–1.030)
WBC, UA: >50 WBC/hpf — ABNORMAL HIGH (ref 0–5)
pH: 6 (ref 5.0–8.0)

## 2021-12-18 LAB — PREGNANCY, URINE: Preg Test, Ur: NEGATIVE

## 2021-12-18 MED ORDER — OXYCODONE-ACETAMINOPHEN 5-325 MG PO TABS: 1.0000 | ORAL_TABLET | Freq: Four times a day (QID) | ORAL | 0 refills | Status: DC | PRN

## 2021-12-18 MED ORDER — CEPHALEXIN 500 MG PO CAPS: 500.0000 mg | ORAL_CAPSULE | Freq: Four times a day (QID) | ORAL | 0 refills | Status: DC

## 2021-12-18 NOTE — ED Triage Notes (Signed)
Pt arrives to ED with c/o bilateral mid back pain x2 days. Right more painful than the left. No numbness/tingling in legs or incontinence. Pain not relieved with OTC/prescription medications.  ?

## 2021-12-18 NOTE — ED Notes (Signed)
Discharge paperwork given and understood. 

## 2021-12-18 NOTE — Discharge Instructions (Signed)
Call your primary care doctor or specialist as discussed in the next 2-3 days.   Return immediately back to the ER if:  Your symptoms worsen within the next 12-24 hours. You develop new symptoms such as new fevers, persistent vomiting, new pain, shortness of breath, or new weakness or numbness, or if you have any other concerns.  

## 2021-12-18 NOTE — ED Provider Notes (Signed)
?[facility] ?Provider Note ? ? ?CSN: [id number] ?Arrival date & time: 12/18/21  1451 ? ?  ? ?History ? ?Chief Complaint  ?Patient presents with  ? Back Pain  ? ? ?Tricia Soto is a 46 y.o. female. ? ?Patient presents to ER chief complaint of lower back pain for about 2 days.  She typically has chronic lower back pain which waxes and wanes in the past.  She was concerned maybe was flaring up over the last 2 days.  Denies any new activity no fall or trauma.  Denies any new numbness or weakness or bowel or bladder dysfunction or abnormal weight loss.  Denies fevers or cough or vomiting or diarrhea.  She had some leftover gabapentin from prior prescriptions and had used that at home along with Aleve and ibuprofen and Tylenol without improvement so presents to the ER. ? ? ?  ? ?Home Medications ?Prior to Admission medications   ?Medication Sig Start Date End Date Taking? Authorizing Provider  ?cephALEXin (KEFLEX) 500 MG capsule Take 1 capsule (500 mg total) by mouth 4 (four) times daily. 12/18/21  Yes Luna Fuse, MD  ?oxyCODONE-acetaminophen (PERCOCET/ROXICET) 5-325 MG tablet Take 1 tablet by mouth every 6 (six) hours as needed for severe pain. 12/18/21  Yes Luna Fuse, MD  ?amitriptyline (ELAVIL) 50 MG tablet Take 1 tablet (50 mg total) by mouth at bedtime. 02/20/21   Ventura Sellers, MD  ?atorvastatin (LIPITOR) 10 MG tablet Take 10 mg by mouth at bedtime. 10/30/19   [provider]  ?Continuous Blood Gluc Transmit (DEXCOM G6 TRANSMITTER) MISC USE 1 DEVICE EVERY 3 MONTHS 10/11/19   [provider]  ?dicyclomine (BENTYL) 20 MG tablet Take 1 tablet (20 mg total) by mouth 2 (two) times daily. 10/20/20   Tedd Sias, PA  ?hydrOXYzine (ATARAX/VISTARIL) 50 MG tablet Take 50 mg by mouth 3 (three) times daily as needed for anxiety.     [provider]  ?hydrOXYzine (VISTARIL) 50 MG capsule Take 50 mg by mouth at bedtime. 10/13/20   [provider]  ?insulin  lispro (HUMALOG) 100 UNIT/ML KwikPen Inject 4-8 Units into the skin 3 (three) times daily as needed (blood sugar 150 or above).    [provider]  ?LANTUS SOLOSTAR 100 UNIT/ML Solostar Pen Inject 12 Units into the skin at bedtime. 03/23/20   [provider]  ?losartan (COZAAR) 25 MG tablet Take 25 mg by mouth every evening. 04/13/19   [provider]  ?ondansetron (ZOFRAN ODT) 4 MG disintegrating tablet Take 1 tablet (4 mg total) by mouth every 8 (eight) hours as needed for nausea or vomiting. 10/20/20   Tedd Sias, PA  ?OZEMPIC, 1 MG/DOSE, 4 MG/3ML SOPN Inject 1 mg into the skin once a week. Take on Tues 10/12/20   [provider]  ?   ? ?Allergies    ?Lisinopril and Tape   ? ?Review of Systems   ?Review of Systems  ?Constitutional:  Negative for fever.  ?HENT:  Negative for ear pain.   ?Eyes:  Negative for pain.  ?Respiratory:  Negative for cough.   ?Cardiovascular:  Negative for chest pain.  ?Gastrointestinal:  Negative for abdominal pain.  ?Genitourinary:  Negative for flank pain.  ?Musculoskeletal:  Positive for back pain.  ?Skin:  Negative for rash.  ?Neurological:  Negative for headaches.  ? ?Physical Exam ?Updated Vital Signs ?BP (!) 159/101 (BP Location: Right Arm)   Pulse 84   Temp 97.6 ?F (36.4 ?  C) (Oral)   Resp 18   Ht '5\' 8"'$  (1.727 m)   Wt 90.7 kg   SpO2 100%   BMI 30.41 kg/m?  ?Physical Exam ?Constitutional:   ?   General: She is not in acute distress. ?   Appearance: Normal appearance.  ?HENT:  ?   Head: Normocephalic.  ?   Nose: Nose normal.  ?Eyes:  ?   Extraocular Movements: Extraocular movements intact.  ?Cardiovascular:  ?   Rate and Rhythm: Normal rate.  ?Pulmonary:  ?   Effort: Pulmonary effort is normal.  ?Abdominal:  ?   Tenderness: There is no abdominal tenderness. There is no guarding or rebound.  ?   Comments: No abdominal wall she does have a large pannus which is folded several times in underneath a spandex like pant .  No clinical evidence of  cellulitis or tenderness or lesion noted.  ?Musculoskeletal:     ?   General: Normal range of motion.  ?   Cervical back: Normal range of motion.  ?   Comments: Mild L2-3 midline tenderness and L2-3 paraspinal tenderness noted.  Patient otherwise able to stand up sit down with no significant distress.  Able to bend forward and back and twist to the left and twist to the right with only mild discomfort.  She is amatory without assistance.  ?Neurological:  ?   General: No focal deficit present.  ?   Mental Status: She is alert. Mental status is at baseline.  ? ? ?ED Results / Procedures / Treatments   ?Labs ?(all labs ordered are listed, but only abnormal results are displayed) ?Labs Reviewed  ?URINALYSIS, ROUTINE W REFLEX MICROSCOPIC - Abnormal; Notable for the following components:  ?    Result Value  ? APPearance HAZY (*)   ? Specific Gravity, Urine 1.034 (*)   ? Glucose, UA 250 (*)   ? Hgb urine dipstick TRACE (*)   ? Protein, ur >300 (*)   ? Leukocytes,Ua LARGE (*)   ? WBC, UA >50 (*)   ? Bacteria, UA RARE (*)   ? All other components within normal limits  ?PREGNANCY, URINE  ? ? ?EKG ?None ? ?Radiology ?CT Renal Stone Study ? ?Result Date: 12/18/2021 ?CLINICAL DATA:  Bilateral flank pain for 2 days, right worse than left EXAM: CT ABDOMEN AND PELVIS WITHOUT CONTRAST TECHNIQUE: Multidetector CT imaging of the abdomen and pelvis was performed following the standard protocol without IV contrast. RADIATION DOSE REDUCTION: This exam was performed according to the departmental dose-optimization program which includes automated exposure control, adjustment of the mA and/or kV according to patient size and/or use of iterative reconstruction technique. COMPARISON:  10/20/2020 FINDINGS: Lower chest: No acute abnormality. Hepatobiliary: Unremarkable unenhanced appearance of the liver. No focal liver lesion identified. Gallbladder within normal limits. No hyperdense gallstone. No biliary dilatation. Pancreas: Unremarkable. No  pancreatic ductal dilatation or surrounding inflammatory changes. Spleen: Normal in size without focal abnormality. Adrenals/Urinary Tract: Unremarkable adrenal glands. The single punctate 1-2 mm stone is seen within each kidney. Kidneys appear otherwise unremarkable. No hydronephrosis. No ureteral calculi identified. Urinary bladder is largely decompressed, limiting its evaluation. Stomach/Bowel: Stomach is within normal limits. Appendix appears normal. No evidence of bowel wall thickening, distention, or inflammatory changes. Vascular/Lymphatic: No significant vascular findings are present. No enlarged abdominal or pelvic lymph nodes. Reproductive: Uterus and bilateral adnexa are unremarkable. Other: No free fluid. No abdominopelvic fluid collection. No pneumoperitoneum. No abdominal wall hernia. Musculoskeletal: Redemonstration of fat stranding within the superficial  subcutaneous soft tissues of the lower anterior abdominal wall, more pronounced on the right (series 2, image 76). No fluid collections. No significant bony abnormality. IMPRESSION: 1. Tiny bilateral nephrolithiasis without evidence for obstructive uropathy. 2. Redemonstration of fat stranding within the superficial subcutaneous soft tissues of the lower anterior abdominal wall, more pronounced on the right. Findings are nonspecific but could represent cellulitis. Correlate with physical exam. Electronically Signed   By: Davina Poke D.O.   On: 12/18/2021 17:07   ? ?Procedures ?Procedures  ? ? ?Medications Ordered in ED ?Medications - No data to display ? ?ED Course/ Medical Decision Making/ A&P ?  ?                        ?Medical Decision Making ?Amount and/or Complexity of Data Reviewed ?Labs: ordered. ?Radiology: ordered. ? ? ?Cardiac monitoring showing sinus rhythm. ? ?CT of abdomen pelvis noncontrast shows evidence of kidney stones in the kidney but no evidence of hydronephrosis.  Some abdominal wall abnormality persistent but unchanged  today. ? ?Urinalysis positive for UTI. ? ?Patient given prescription of pain medication and antibiotics.  Advised outpatient follow-up with her doctor in 3 to 4 days.  Advised immediate return if she has fevers o

## 2021-12-21 DIAGNOSIS — Z794 Long term (current) use of insulin: Secondary | ICD-10-CM | POA: Diagnosis not present

## 2021-12-21 DIAGNOSIS — Z1231 Encounter for screening mammogram for malignant neoplasm of breast: Secondary | ICD-10-CM | POA: Diagnosis not present

## 2021-12-21 DIAGNOSIS — Z1211 Encounter for screening for malignant neoplasm of colon: Secondary | ICD-10-CM | POA: Diagnosis not present

## 2021-12-21 DIAGNOSIS — E119 Type 2 diabetes mellitus without complications: Secondary | ICD-10-CM | POA: Diagnosis not present

## 2021-12-21 DIAGNOSIS — M545 Low back pain, unspecified: Secondary | ICD-10-CM | POA: Diagnosis not present

## 2021-12-29 DIAGNOSIS — E1129 Type 2 diabetes mellitus with other diabetic kidney complication: Secondary | ICD-10-CM | POA: Diagnosis not present

## 2022-01-28 DIAGNOSIS — E1129 Type 2 diabetes mellitus with other diabetic kidney complication: Secondary | ICD-10-CM | POA: Diagnosis not present

## 2022-02-16 ENCOUNTER — Ambulatory Visit
Admission: RE | Admit: 2022-02-16 | Discharge: 2022-02-16 | Disposition: A | Discharge status: Home / Self Care | Payer: BC Managed Care – PPO | Source: Ambulatory Visit | Attending: Internal Medicine | Admitting: Internal Medicine

## 2022-02-16 DIAGNOSIS — D329 Benign neoplasm of meninges, unspecified: Secondary | ICD-10-CM

## 2022-02-16 DIAGNOSIS — R220 Localized swelling, mass and lump, head: Secondary | ICD-10-CM | POA: Diagnosis not present

## 2022-02-16 MED ORDER — GADOBENATE DIMEGLUMINE 529 MG/ML IV SOLN
20.0000 mL | Freq: Once | INTRAVENOUS | Status: AC | PRN
  Administered 2022-02-16: 20 mL via INTRAVENOUS

## 2022-02-19 ENCOUNTER — Inpatient Hospital Stay: Payer: BC Managed Care – PPO | Attending: Internal Medicine

## 2022-02-19 DIAGNOSIS — Z79899 Other long term (current) drug therapy: Secondary | ICD-10-CM | POA: Insufficient documentation

## 2022-02-19 DIAGNOSIS — Z923 Personal history of irradiation: Secondary | ICD-10-CM | POA: Insufficient documentation

## 2022-02-19 DIAGNOSIS — D320 Benign neoplasm of cerebral meninges: Secondary | ICD-10-CM | POA: Insufficient documentation

## 2022-02-22 ENCOUNTER — Inpatient Hospital Stay (HOSPITAL_BASED_OUTPATIENT_CLINIC_OR_DEPARTMENT_OTHER): Payer: BC Managed Care – PPO | Admitting: Internal Medicine

## 2022-02-22 ENCOUNTER — Other Ambulatory Visit: Payer: Self-pay

## 2022-02-22 VITALS — BP 144/92 | HR 90 | Temp 97.7°F | Resp 18 | Ht 68.0 in | Wt 194.5 lb

## 2022-02-22 DIAGNOSIS — Z79899 Other long term (current) drug therapy: Secondary | ICD-10-CM | POA: Diagnosis not present

## 2022-02-22 DIAGNOSIS — D329 Benign neoplasm of meninges, unspecified: Secondary | ICD-10-CM

## 2022-02-22 DIAGNOSIS — Z923 Personal history of irradiation: Secondary | ICD-10-CM | POA: Diagnosis not present

## 2022-02-22 DIAGNOSIS — D320 Benign neoplasm of cerebral meninges: Secondary | ICD-10-CM | POA: Diagnosis not present

## 2022-02-22 MED ORDER — AMITRIPTYLINE HCL 50 MG PO TABS: 50.0000 mg | ORAL_TABLET | Freq: Every day | ORAL | 5 refills | Status: DC

## 2022-02-22 NOTE — Progress Notes (Signed)
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 Zada Finders 07/30/19: SRS to residual tumor Lisbeth Renshaw)  Interval History:  Tricia Soto presents today for follow up after recent MRI brain.  Vision has been stable.  Migraines are stable overall.  Has had few episodes of dizziness which were self limited. She still complains of "funny feelings at times" around surgical site.   H+P (08/03/19) Patient presents today for follow up after having completed radiation therapy last week.  She initially began experiencing left sided head/facial pain episodes in September 2020, different in nature from her established migraine headache syndrome.  After no relief from traditional pain medications, she obtained as CT (and later MRI) which demonstrated mass abutting the clivus and brainstem.  She went for resection with Dr. Zada Finders on 06/23/19, after which she began to experience double vision.  Double vision, which is persistent to today, is described as "seeing objects side by side when looking straight ahead, worse to the left".  In addition she has experienced some facial numbness, although this is not dense or consistent.  Finally, she feels her left ear hearing was poor following surgery, but this has improved considerably, nearly to baseline.  No issues with radiation.  Currently she is concerned about her ability to drive and return to work given her visual impairments.  Medications: Current Outpatient Medications on File Prior to Visit  Medication Sig Dispense Refill   amitriptyline (ELAVIL) 50 MG tablet  Take 1 tablet (50 mg total) by mouth at bedtime. 60 tablet 5   gabapentin (NEURONTIN) 300 MG capsule Take 300-900 mg by mouth at bedtime as needed.     hydrOXYzine (ATARAX/VISTARIL) 50 MG tablet Take 50 mg by mouth 3 (three) times daily as needed for anxiety.      losartan (COZAAR) 25 MG tablet Take 25 mg by mouth every evening.     MOUNJARO 15 MG/0.5ML Pen Inject 15 mg into the skin once a week.     butalbital-acetaminophen-caffeine (FIORICET) 50-325-40 MG tablet Take 1 tablet by mouth as needed. (Patient not taking: Reported on 02/22/2022)     ondansetron (ZOFRAN ODT) 4 MG disintegrating tablet Take 1 tablet (4 mg total) by mouth every 8 (eight) hours as needed for nausea or vomiting. (Patient not taking: Reported on 02/22/2022) 20 tablet 0   traMADol-acetaminophen (ULTRACET) 37.5-325 MG tablet Take 1 tablet by mouth every 4 (four) hours as needed.     No current facility-administered medications on file prior to visit.    Allergies:  Allergies  Allergen Reactions   Lisinopril Cough and Other (See Comments)   Tape Other (See Comments)    Electrodes used for EKG. Leaves "marks" on the skin. Patient told to be careful using bandages. Ok to use Paper Tape.    Past Medical History:  Past Medical History:  Diagnosis Date   Chronic headaches    Diabetes mellitus without complication (HCC)    GERD (gastroesophageal reflux disease)    Hypertension    Meningioma ([place])    Past Surgical History:  Past Surgical History:  Procedure Laterality Date   APPLICATION OF CRANIAL NAVIGATION N/A 06/23/2019  Procedure: APPLICATION OF CRANIAL NAVIGATION;  Surgeon: Judith Part, MD;  Location: [facility];  Service: Neurosurgery;  Laterality: N/A;   CRANIOTOMY Left 06/23/2019   Procedure: Left craniotomy for tumor resection with brainlab/facial nerve monitoring;  Surgeon: Judith Part, MD;  Location: [facility];  Service: Neurosurgery;  Laterality: Left;   THYROIDECTOMY, PARTIAL     TUBAL LIGATION      1997   tubal ligation reversal  2013   Social History:  Social History   Socioeconomic History   Marital status: Married    Spouse name: Not on file   Number of children: Not on file   Years of education: Not on file   Highest education level: Not on file  Occupational History   Not on file  Tobacco Use   Smoking status: Never   Smokeless tobacco: Never  Vaping Use   Vaping Use: Never used  Substance and Sexual Activity   Alcohol use: Not Currently   Drug use: Never   Sexual activity: Not on file  Other Topics Concern   Not on file  Social History Narrative   Not on file   Social Determinants of Health   Financial Resource Strain: Not on file  Food Insecurity: Not on file  Transportation Needs: Not on file  Physical Activity: Not on file  Stress: Not on file  Social Connections: Not on file  Intimate Partner Violence: Not on file   Family History: No family history on file.  Review of Systems: Constitutional: Denies fevers, chills or abnormal weight loss Eyes: Denies blurriness of vision Ears, nose, mouth, throat, and face: Denies mucositis or sore throat Respiratory: Denies cough, dyspnea or wheezes Cardiovascular: Denies palpitation, chest discomfort or lower extremity swelling Gastrointestinal:  Denies nausea, constipation, diarrhea GU: Denies dysuria or incontinence Skin: Denies abnormal skin rashes Neurological: Per HPI Musculoskeletal: Denies joint pain, back or neck discomfort. No decrease in ROM Behavioral/Psych: Denies anxiety, disturbance in thought content, and mood instability  Physical Exam: Vitals:   02/22/22 1057  BP: (!) 144/92  Pulse: 90  Resp: 18  Temp: 97.7 F (36.5 C)  SpO2: 100%   KPS: 90. General: Alert, cooperative, pleasant, in no acute distress Head: Normal EENT: No conjunctival injection or scleral icterus. Oral mucosa moist Lungs: Resp effort normal Cardiac: Regular rate and rhythm Abdomen:  non-distended  abdomen Skin: No rashes cyanosis or petechiae. Extremities: No clubbing or edema  Neurologic Exam: Mental Status: Awake, alert, attentive to examiner. Oriented to self and environment. Language is fluent with intact comprehension.  Cranial Nerves: Visual acuity is grossly normal. Visual fields are full. Left CNVI paresis. No ptosis. Face is symmetric. Motor: Tone and bulk are normal. Power is full in both arms and legs. Reflexes are symmetric, no pathologic reflexes present. Sensory: Intact to light touch and temperature Gait: Normal   Labs: I have reviewed the data as listed    Component Value Date/Time   NA 135 12/29/2020 1946   K 3.5 12/29/2020 1946   CL 104 12/29/2020 1946   CO2 20 (L) 12/29/2020 1946   GLUCOSE 345 (H) 12/29/2020 1946   BUN 9 12/29/2020 1946   CREATININE 1.10 (H) 12/29/2020 1946   CALCIUM 8.8 (L) 12/29/2020 1946   PROT 8.3 (H) 10/20/2020 1228   ALBUMIN 3.8 10/20/2020 1228   AST 11 (L) 10/20/2020 1228   ALT 11 10/20/2020 1228   ALKPHOS 102 10/20/2020 1228   BILITOT 1.1 10/20/2020 1228   GFRNONAA >60 12/29/2020 1946  GFRAA >60 06/23/2019 1536   Lab Results  Component Value Date   WBC 10.1 12/29/2020   NEUTROABS 5.2 04/22/2019   HGB 11.4 (L) 12/29/2020   HCT 34.5 (L) 12/29/2020   MCV 86.3 12/29/2020   PLT 153 12/29/2020    Imaging:  [facility] Clinician Interpretation: I have personally reviewed the CNS images as listed.  My interpretation, in the context of the patient's clinical presentation, is stable disease  MR BRAIN W WO CONTRAST  Result Date: 02/18/2022 CLINICAL DATA:  Meningioma follow-up. History of brain tumor removal in 2020 EXAM: MRI HEAD WITHOUT AND WITH CONTRAST TECHNIQUE: Multiplanar, multiecho pulse sequences of the brain and surrounding structures were obtained without and with intravenous contrast. CONTRAST:  25m MULTIHANCE GADOBENATE DIMEGLUMINE 529 MG/ML IV SOLN COMPARISON:  02/17/2021 FINDINGS: Brain: Meningioma along the left petrous  ridge, just posterior to the cavernous sinus and above the retro clival venous plexus, up to 12.5 by 9 mm, unchanged when remeasured in a similar fashion. The mass is just below the left PCA, which is flowing. Patent left Meckel's cave. Asymmetric enhancement at the left internal auditory canal which could be dural tail or accentuated venous plexus, unchanged. Stable regional blood products from prior left retromastoid surgery. No acute or interval infarct, hydrocephalus, or collection. Vascular: Major flow voids and vascular enhancements are preserved Skull and upper cervical spine: Prior left retromastoid craniotomy. Sinuses/Orbits: Negative IMPRESSION: Stable meningioma along the left petrous ridge. Electronically Signed   By: JJorje GuildM.D.   On: 02/18/2022 07:12      Assessment/Plan Meningi[place] ([place] [D32.9]  We appreciate the opportunity to participate in the care of [place].  No new or progressive deficits, MRI shows stable findings.  Ok to continue Elavil for headache prevention, '50mg'$  HS.  For opthalmoplegia, [facility].   We ask that JPhineas Douglasreturn to clinic in 12 months following next brain MRI, or sooner as needed.  All questions were answered. The patient knows to call the clinic with any problems, questions or concerns. No barriers to learning were detected.  I have spent a total of 30 minutes of face-to-face and non-face-to-face time, excluding clinical staff time, preparing to see patient, ordering tests and/or medications, counseling the patient, and independently interpreting results and communicating results to the patient/family/caregiver    ZVentura Sellers MD Medical Director of Neuro[facility]07/13/23 11:16 AM

## 2022-02-22 NOTE — Addendum Note (Signed)
Addended by: Wilmon Arms on: 02/22/2022 01:45 PM   Modules accepted: Orders

## 2022-02-26 DIAGNOSIS — S0990XA Unspecified injury of head, initial encounter: Secondary | ICD-10-CM | POA: Diagnosis not present

## 2022-02-26 DIAGNOSIS — Z79899 Other long term (current) drug therapy: Secondary | ICD-10-CM | POA: Diagnosis not present

## 2022-02-26 DIAGNOSIS — M542 Cervicalgia: Secondary | ICD-10-CM | POA: Diagnosis not present

## 2022-02-26 DIAGNOSIS — R519 Headache, unspecified: Secondary | ICD-10-CM | POA: Diagnosis not present

## 2022-02-26 DIAGNOSIS — Y92410 Unspecified street and highway as the place of occurrence of the external cause: Secondary | ICD-10-CM | POA: Diagnosis not present

## 2022-02-26 DIAGNOSIS — E119 Type 2 diabetes mellitus without complications: Secondary | ICD-10-CM | POA: Diagnosis not present

## 2022-02-26 DIAGNOSIS — S161XXA Strain of muscle, fascia and tendon at neck level, initial encounter: Secondary | ICD-10-CM | POA: Diagnosis not present

## 2022-02-26 DIAGNOSIS — Z888 Allergy status to other drugs, medicaments and biological substances status: Secondary | ICD-10-CM | POA: Diagnosis not present

## 2022-02-26 DIAGNOSIS — I10 Essential (primary) hypertension: Secondary | ICD-10-CM | POA: Diagnosis not present

## 2022-03-01 ENCOUNTER — Other Ambulatory Visit: Payer: Self-pay | Admitting: Radiation Therapy

## 2022-03-01 DIAGNOSIS — M545 Low back pain, unspecified: Secondary | ICD-10-CM | POA: Diagnosis not present

## 2022-03-01 DIAGNOSIS — M25561 Pain in right knee: Secondary | ICD-10-CM | POA: Diagnosis not present

## 2022-03-02 DIAGNOSIS — M25561 Pain in right knee: Secondary | ICD-10-CM | POA: Diagnosis not present

## 2022-03-07 DIAGNOSIS — E1129 Type 2 diabetes mellitus with other diabetic kidney complication: Secondary | ICD-10-CM | POA: Diagnosis not present

## 2022-03-20 DIAGNOSIS — Z794 Long term (current) use of insulin: Secondary | ICD-10-CM | POA: Diagnosis not present

## 2022-03-20 DIAGNOSIS — Z114 Encounter for screening for human immunodeficiency virus [HIV]: Secondary | ICD-10-CM | POA: Diagnosis not present

## 2022-03-20 DIAGNOSIS — Z79899 Other long term (current) drug therapy: Secondary | ICD-10-CM | POA: Diagnosis not present

## 2022-03-20 DIAGNOSIS — Z1331 Encounter for screening for depression: Secondary | ICD-10-CM | POA: Diagnosis not present

## 2022-03-20 DIAGNOSIS — E119 Type 2 diabetes mellitus without complications: Secondary | ICD-10-CM | POA: Diagnosis not present

## 2022-03-20 DIAGNOSIS — Z1159 Encounter for screening for other viral diseases: Secondary | ICD-10-CM | POA: Diagnosis not present

## 2022-03-20 DIAGNOSIS — M47816 Spondylosis without myelopathy or radiculopathy, lumbar region: Secondary | ICD-10-CM | POA: Diagnosis not present

## 2022-03-20 DIAGNOSIS — Z0000 Encounter for general adult medical examination without abnormal findings: Secondary | ICD-10-CM | POA: Diagnosis not present

## 2022-03-21 ENCOUNTER — Encounter (INDEPENDENT_AMBULATORY_CARE_PROVIDER_SITE_OTHER): Payer: Self-pay

## 2022-04-06 DIAGNOSIS — E1129 Type 2 diabetes mellitus with other diabetic kidney complication: Secondary | ICD-10-CM | POA: Diagnosis not present

## 2022-04-09 DIAGNOSIS — E042 Nontoxic multinodular goiter: Secondary | ICD-10-CM | POA: Diagnosis not present

## 2022-04-09 DIAGNOSIS — E119 Type 2 diabetes mellitus without complications: Secondary | ICD-10-CM | POA: Diagnosis not present

## 2022-04-09 DIAGNOSIS — I10 Essential (primary) hypertension: Secondary | ICD-10-CM | POA: Diagnosis not present

## 2022-04-09 DIAGNOSIS — E785 Hyperlipidemia, unspecified: Secondary | ICD-10-CM | POA: Diagnosis not present

## 2022-04-17 DIAGNOSIS — M5136 Other intervertebral disc degeneration, lumbar region: Secondary | ICD-10-CM | POA: Diagnosis not present

## 2022-04-17 DIAGNOSIS — M545 Low back pain, unspecified: Secondary | ICD-10-CM | POA: Diagnosis not present

## 2022-04-17 DIAGNOSIS — T1490XA Injury, unspecified, initial encounter: Secondary | ICD-10-CM | POA: Diagnosis not present

## 2022-04-17 DIAGNOSIS — M5137 Other intervertebral disc degeneration, lumbosacral region: Secondary | ICD-10-CM | POA: Diagnosis not present

## 2022-04-17 DIAGNOSIS — R937 Abnormal findings on diagnostic imaging of other parts of musculoskeletal system: Secondary | ICD-10-CM | POA: Diagnosis not present

## 2022-05-06 DIAGNOSIS — E1129 Type 2 diabetes mellitus with other diabetic kidney complication: Secondary | ICD-10-CM | POA: Diagnosis not present

## 2022-05-10 DIAGNOSIS — I10 Essential (primary) hypertension: Secondary | ICD-10-CM | POA: Diagnosis not present

## 2022-05-10 DIAGNOSIS — K219 Gastro-esophageal reflux disease without esophagitis: Secondary | ICD-10-CM | POA: Diagnosis not present

## 2022-05-10 DIAGNOSIS — Z1211 Encounter for screening for malignant neoplasm of colon: Secondary | ICD-10-CM | POA: Diagnosis not present

## 2022-05-10 DIAGNOSIS — K648 Other hemorrhoids: Secondary | ICD-10-CM | POA: Diagnosis not present

## 2022-05-23 DIAGNOSIS — M5416 Radiculopathy, lumbar region: Secondary | ICD-10-CM | POA: Diagnosis not present

## 2022-06-01 DIAGNOSIS — M5126 Other intervertebral disc displacement, lumbar region: Secondary | ICD-10-CM | POA: Diagnosis not present

## 2022-06-01 DIAGNOSIS — M5416 Radiculopathy, lumbar region: Secondary | ICD-10-CM | POA: Diagnosis not present

## 2022-06-03 DIAGNOSIS — M5416 Radiculopathy, lumbar region: Secondary | ICD-10-CM | POA: Diagnosis not present

## 2022-06-03 DIAGNOSIS — M5126 Other intervertebral disc displacement, lumbar region: Secondary | ICD-10-CM | POA: Diagnosis not present

## 2022-06-13 DIAGNOSIS — E1129 Type 2 diabetes mellitus with other diabetic kidney complication: Secondary | ICD-10-CM | POA: Diagnosis not present

## 2022-06-19 DIAGNOSIS — G4709 Other insomnia: Secondary | ICD-10-CM | POA: Diagnosis not present

## 2022-07-13 DIAGNOSIS — E1129 Type 2 diabetes mellitus with other diabetic kidney complication: Secondary | ICD-10-CM | POA: Diagnosis not present

## 2022-07-24 DIAGNOSIS — M5416 Radiculopathy, lumbar region: Secondary | ICD-10-CM | POA: Diagnosis not present

## 2022-07-24 DIAGNOSIS — M5126 Other intervertebral disc displacement, lumbar region: Secondary | ICD-10-CM | POA: Diagnosis not present

## 2022-08-12 DIAGNOSIS — E1129 Type 2 diabetes mellitus with other diabetic kidney complication: Secondary | ICD-10-CM | POA: Diagnosis not present

## 2022-08-20 DIAGNOSIS — G4700 Insomnia, unspecified: Secondary | ICD-10-CM | POA: Diagnosis not present

## 2022-09-07 DIAGNOSIS — M5126 Other intervertebral disc displacement, lumbar region: Secondary | ICD-10-CM | POA: Diagnosis not present

## 2022-09-07 DIAGNOSIS — M5416 Radiculopathy, lumbar region: Secondary | ICD-10-CM | POA: Diagnosis not present

## 2022-09-19 DIAGNOSIS — N179 Acute kidney failure, unspecified: Secondary | ICD-10-CM | POA: Diagnosis not present

## 2022-09-19 DIAGNOSIS — E1129 Type 2 diabetes mellitus with other diabetic kidney complication: Secondary | ICD-10-CM | POA: Diagnosis not present

## 2022-09-20 DIAGNOSIS — N1831 Chronic kidney disease, stage 3a: Secondary | ICD-10-CM | POA: Diagnosis not present

## 2022-09-20 DIAGNOSIS — E119 Type 2 diabetes mellitus without complications: Secondary | ICD-10-CM | POA: Diagnosis not present

## 2022-09-20 DIAGNOSIS — Z794 Long term (current) use of insulin: Secondary | ICD-10-CM | POA: Diagnosis not present

## 2022-09-20 DIAGNOSIS — R7989 Other specified abnormal findings of blood chemistry: Secondary | ICD-10-CM | POA: Diagnosis not present

## 2022-09-20 DIAGNOSIS — M545 Low back pain, unspecified: Secondary | ICD-10-CM | POA: Diagnosis not present

## 2022-10-05 DIAGNOSIS — N1831 Chronic kidney disease, stage 3a: Secondary | ICD-10-CM | POA: Diagnosis not present

## 2022-10-07 DIAGNOSIS — B3731 Acute candidiasis of vulva and vagina: Secondary | ICD-10-CM | POA: Diagnosis not present

## 2022-10-15 DIAGNOSIS — I10 Essential (primary) hypertension: Secondary | ICD-10-CM | POA: Diagnosis not present

## 2022-10-15 DIAGNOSIS — E785 Hyperlipidemia, unspecified: Secondary | ICD-10-CM | POA: Diagnosis not present

## 2022-10-15 DIAGNOSIS — E1169 Type 2 diabetes mellitus with other specified complication: Secondary | ICD-10-CM | POA: Diagnosis not present

## 2022-10-15 DIAGNOSIS — E042 Nontoxic multinodular goiter: Secondary | ICD-10-CM | POA: Diagnosis not present

## 2022-10-19 DIAGNOSIS — E1129 Type 2 diabetes mellitus with other diabetic kidney complication: Secondary | ICD-10-CM | POA: Diagnosis not present

## 2022-10-22 DIAGNOSIS — I10 Essential (primary) hypertension: Secondary | ICD-10-CM | POA: Diagnosis not present

## 2022-10-29 DIAGNOSIS — E042 Nontoxic multinodular goiter: Secondary | ICD-10-CM | POA: Diagnosis not present

## 2022-10-29 DIAGNOSIS — E1169 Type 2 diabetes mellitus with other specified complication: Secondary | ICD-10-CM | POA: Diagnosis not present

## 2022-11-18 DIAGNOSIS — E1129 Type 2 diabetes mellitus with other diabetic kidney complication: Secondary | ICD-10-CM | POA: Diagnosis not present

## 2022-11-28 DIAGNOSIS — B3731 Acute candidiasis of vulva and vagina: Secondary | ICD-10-CM | POA: Diagnosis not present

## 2022-12-10 DIAGNOSIS — Z719 Counseling, unspecified: Secondary | ICD-10-CM | POA: Diagnosis not present

## 2022-12-26 DIAGNOSIS — E1129 Type 2 diabetes mellitus with other diabetic kidney complication: Secondary | ICD-10-CM | POA: Diagnosis not present

## 2023-01-16 DIAGNOSIS — Z8489 Family history of other specified conditions: Secondary | ICD-10-CM | POA: Diagnosis not present

## 2023-01-16 DIAGNOSIS — Z124 Encounter for screening for malignant neoplasm of cervix: Secondary | ICD-10-CM | POA: Diagnosis not present

## 2023-01-16 DIAGNOSIS — Z01419 Encounter for gynecological examination (general) (routine) without abnormal findings: Secondary | ICD-10-CM | POA: Diagnosis not present

## 2023-01-16 DIAGNOSIS — R102 Pelvic and perineal pain: Secondary | ICD-10-CM | POA: Diagnosis not present

## 2023-01-28 DIAGNOSIS — E1169 Type 2 diabetes mellitus with other specified complication: Secondary | ICD-10-CM | POA: Diagnosis not present

## 2023-01-28 DIAGNOSIS — I10 Essential (primary) hypertension: Secondary | ICD-10-CM | POA: Diagnosis not present

## 2023-01-28 DIAGNOSIS — E042 Nontoxic multinodular goiter: Secondary | ICD-10-CM | POA: Diagnosis not present

## 2023-01-28 DIAGNOSIS — R7989 Other specified abnormal findings of blood chemistry: Secondary | ICD-10-CM | POA: Diagnosis not present

## 2023-01-28 DIAGNOSIS — Z79899 Other long term (current) drug therapy: Secondary | ICD-10-CM | POA: Diagnosis not present

## 2023-01-28 DIAGNOSIS — E785 Hyperlipidemia, unspecified: Secondary | ICD-10-CM | POA: Diagnosis not present

## 2023-01-28 DIAGNOSIS — Z0100 Encounter for examination of eyes and vision without abnormal findings: Secondary | ICD-10-CM | POA: Diagnosis not present

## 2023-02-04 ENCOUNTER — Other Ambulatory Visit: Payer: Self-pay | Admitting: Internal Medicine

## 2023-02-11 DIAGNOSIS — H4922 Sixth [abducent] nerve palsy, left eye: Secondary | ICD-10-CM | POA: Diagnosis not present

## 2023-02-11 DIAGNOSIS — H25813 Combined forms of age-related cataract, bilateral: Secondary | ICD-10-CM | POA: Diagnosis not present

## 2023-02-11 DIAGNOSIS — E119 Type 2 diabetes mellitus without complications: Secondary | ICD-10-CM | POA: Diagnosis not present

## 2023-02-11 DIAGNOSIS — H532 Diplopia: Secondary | ICD-10-CM | POA: Diagnosis not present

## 2023-02-18 DIAGNOSIS — B3731 Acute candidiasis of vulva and vagina: Secondary | ICD-10-CM | POA: Diagnosis not present

## 2023-02-22 ENCOUNTER — Ambulatory Visit
Admission: RE | Admit: 2023-02-22 | Discharge: 2023-02-22 | Disposition: A | Discharge status: Home / Self Care | Payer: BC Managed Care – PPO | Source: Ambulatory Visit | Attending: Internal Medicine | Admitting: Internal Medicine

## 2023-02-22 DIAGNOSIS — D329 Benign neoplasm of meninges, unspecified: Secondary | ICD-10-CM

## 2023-02-22 DIAGNOSIS — D320 Benign neoplasm of cerebral meninges: Secondary | ICD-10-CM | POA: Diagnosis not present

## 2023-02-22 MED ORDER — GADOPICLENOL 0.5 MMOL/ML IV SOLN
8.0000 mL | Freq: Once | INTRAVENOUS | Status: AC | PRN
  Administered 2023-02-22: 8 mL via INTRAVENOUS

## 2023-02-25 ENCOUNTER — Other Ambulatory Visit: Payer: Self-pay

## 2023-02-25 ENCOUNTER — Inpatient Hospital Stay: Payer: BC Managed Care – PPO | Attending: Internal Medicine | Admitting: Internal Medicine

## 2023-02-25 ENCOUNTER — Inpatient Hospital Stay: Payer: BC Managed Care – PPO

## 2023-02-25 VITALS — BP 137/86 | HR 80 | Temp 97.9°F | Resp 20 | Wt 176.4 lb

## 2023-02-25 DIAGNOSIS — D329 Benign neoplasm of meninges, unspecified: Secondary | ICD-10-CM | POA: Insufficient documentation

## 2023-02-25 NOTE — Progress Notes (Signed)
[facility] [address]  [place], [place] [address] [phone]   Interval Evaluation  Date of Service: 02/25/23 Patient Name: Tricia Soto Patient MRN: [id number] Patient DOB: 1975/09/14 Provider: Henreitta Leber, MD  Identifying Statement:  Tricia Soto is a 47 y.o. 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 Maurice Small 07/30/19: SRS to residual tumor Tricia Soto)  Interval History:  Tricia Soto presents today for follow up after recent MRI brain.  Vision has been stable.  Headaches are sporadic and self limited.  Has had few episodes of dizziness which were self limited.  Denies seizures.  H+P (08/03/19) Patient presents today for follow up after having completed radiation therapy last week.  She initially began experiencing left sided head/facial pain episodes in September 2020, different in nature from her established migraine headache syndrome.  After no relief from traditional pain medications, she obtained as CT (and later MRI) which demonstrated mass abutting the clivus and brainstem.  She went for resection with Dr. Maurice Small on 06/23/19, after which she began to experience double vision.  Double vision, which is persistent to today, is described as "seeing objects side by side when looking straight ahead, worse to the left".  In addition she has experienced some facial numbness, although this is not dense or consistent.  Finally, she feels her left ear hearing was poor following surgery, but this has improved considerably, nearly to baseline.  No issues with radiation.  Currently she is concerned about her ability to drive and return to work given her visual impairments.  Medications: Current Outpatient Medications on File Prior to Visit  Medication Sig Dispense Refill   amitriptyline (ELAVIL) 50 MG tablet TAKE 1 TABLET BY MOUTH ONCE DAILY AT  BEDTIME 60 tablet 0   losartan (COZAAR) 25 MG tablet Take 25 mg by mouth every evening.     MOUNJARO 15 MG/0.5ML Pen Inject 15 mg into the skin once a week.     norethindrone (AYGESTIN) 5 MG tablet Take 5 mg by mouth daily.     atorvastatin (LIPITOR) 10 MG tablet Take 10 mg by mouth daily.     butalbital-acetaminophen-caffeine (FIORICET) 50-325-40 MG tablet Take 1 tablet by mouth as needed. (Patient not taking: Reported on 02/22/2022)     gabapentin (NEURONTIN) 300 MG capsule Take 300-900 mg by mouth at bedtime as needed. (Patient not taking: Reported on 02/25/2023)     JARDIANCE 25 MG TABS tablet Take 25 mg by mouth daily.     ondansetron (ZOFRAN ODT) 4 MG disintegrating tablet Take 1 tablet (4 mg total) by mouth every 8 (eight) hours as needed for nausea or vomiting. (Patient not taking: Reported on 02/22/2022) 20 tablet 0   No current facility-administered medications on file prior to visit.    Allergies:  Allergies  Allergen Reactions   Lisinopril Cough and Other (See Comments)   Tape Other (See Comments)    Electrodes used for EKG. Leaves "marks" on the skin. Patient told to be careful using bandages. Ok to use Paper Tape.    Past Medical History:  Past Medical History:  Diagnosis Date   Chronic headaches    Diabetes mellitus without complication (HCC)    GERD (gastroesophageal reflux disease)    Hypertension    Meningioma (HCC)    Past Surgical History:  Past Surgical History:  Procedure Laterality Date   APPLICATION OF CRANIAL NAVIGATION N/A 06/23/2019  Procedure: APPLICATION OF CRANIAL NAVIGATION;  Surgeon: Jadene Pierini, MD;  Location: [facility] OR;  Service: Neurosurgery;  Laterality: N/A;   CRANIOTOMY Left 06/23/2019   Procedure: Left craniotomy for tumor resection with brainlab/facial nerve monitoring;  Surgeon: Jadene Pierini, MD;  Location: [facility] OR;  Service: Neurosurgery;  Laterality: Left;   THYROIDECTOMY, PARTIAL     TUBAL LIGATION     1997   tubal ligation  reversal  2013   Social History:  Social History   Socioeconomic History   Marital status: Married    Spouse name: Not on file   Number of children: Not on file   Years of education: Not on file   Highest education level: Not on file  Occupational History   Not on file  Tobacco Use   Smoking status: Never   Smokeless tobacco: Never  Vaping Use   Vaping status: Never Used  Substance and Sexual Activity   Alcohol use: Not Currently   Drug use: Never   Sexual activity: Not on file  Other Topics Concern   Not on file  Social History Narrative   Not on file   Social Determinants of Health   Financial Resource Strain: Low Risk  (01/28/2023)   Received from [facility], [facility]   Overall Financial Resource Strain (CARDIA)    Difficulty of Paying Living Expenses: Not very hard  Food Insecurity: No Food Insecurity (01/28/2023)   Received from [facility], [facility]   Hunger Vital Sign    Worried About Running Out of Food in the Last Year: Never true    Ran Out of Food in the Last Year: Never true  Transportation Needs: No Transportation Needs (01/28/2023)   Received from [facility], [facility]   [facility] - Transportation    In the past 12 months, has lack of transportation kept you from medical appointments or from getting medications?: No    Lack of Transportation (Non-Medical): No  Physical Activity: Not on file  Stress: Not on file  Social Connections: Unknown (12/26/2021)   Received from [facility], [facility]   Social Network    Social Network: Not on file  Intimate Partner Violence: Unknown (11/17/2021)   Received from [facility], [facility]   HITS    Physically Hurt: Not on file    Insult or Talk Down To: Not on file    Threaten Physical Harm: Not on file    Scream or Curse: Not on file   Family History: No family history on file.  Review of  Systems: Constitutional: Denies fevers, chills or abnormal weight loss Eyes: Denies blurriness of vision Ears, nose, mouth, throat, and face: Denies mucositis or sore throat Respiratory: Denies cough, dyspnea or wheezes Cardiovascular: Denies palpitation, chest discomfort or lower extremity swelling Gastrointestinal:  Denies nausea, constipation, diarrhea GU: Denies dysuria or incontinence Skin: Denies abnormal skin rashes Neurological: Per HPI Musculoskeletal: Denies joint pain, back or neck discomfort. No decrease in ROM Behavioral/Psych: Denies anxiety, disturbance in thought content, and mood instability  Physical Exam: Vitals:   02/25/23 1040  BP: 137/86  Pulse: 80  Resp: 20  Temp: 97.9 F (36.6 C)  SpO2: 100%   KPS: 90. General: Alert, cooperative, pleasant, in no acute distress Head: Normal EENT: No conjunctival injection or scleral icterus. Oral mucosa moist Lungs: Resp effort normal Cardiac: Regular rate and rhythm Abdomen:  non-distended abdomen Skin: No rashes cyanosis or petechiae.  Extremities: No clubbing or edema  Neurologic Exam: Mental Status: Awake, alert, attentive to examiner. Oriented to self and environment. Language is fluent with intact comprehension.  Cranial Nerves: Visual acuity is grossly normal. Visual fields are full. Left CNVI paresis. No ptosis. Face is symmetric. Motor: Tone and bulk are normal. Power is full in both arms and legs. Reflexes are symmetric, no pathologic reflexes present. Sensory: Intact to light touch and temperature Gait: Normal   Labs: I have reviewed the data as listed    Component Value Date/Time   NA 135 12/29/2020 1946   K 3.5 12/29/2020 1946   CL 104 12/29/2020 1946   CO2 20 (L) 12/29/2020 1946   GLUCOSE 345 (H) 12/29/2020 1946   BUN 9 12/29/2020 1946   CREATININE 1.10 (H) 12/29/2020 1946   CALCIUM 8.8 (L) 12/29/2020 1946   PROT 8.3 (H) 10/20/2020 1228   ALBUMIN 3.8 10/20/2020 1228   AST 11 (L) 10/20/2020  1228   ALT 11 10/20/2020 1228   ALKPHOS 102 10/20/2020 1228   BILITOT 1.1 10/20/2020 1228   GFRNONAA >60 12/29/2020 1946   GFRAA >60 06/23/2019 1536   Lab Results  Component Value Date   WBC 10.1 12/29/2020   NEUTROABS 5.2 04/22/2019   HGB 11.4 (L) 12/29/2020   HCT 34.5 (L) 12/29/2020   MCV 86.3 12/29/2020   PLT 153 12/29/2020    Imaging:  [facility] Clinician Interpretation: I have personally reviewed the CNS images as listed.  My interpretation, in the context of the patient's clinical presentation, is stable disease pending official read  No results found.   Assessment/Plan Meningioma (HCC) [D32.9]  Tricia Soto is clinically stable today.  No new or progressive deficits, MRI shows stable findings pending official.  Ok to continue Elavil for headache prevention, 50mg  HS.  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   We ask that Kipp Brood return to clinic in 12 months following next brain MRI, or sooner as needed.  All questions were answered. The patient knows to call the clinic with any problems, questions or concerns. No barriers to learning were detected.  I have spent a total of 30 minutes of face-to-face and non-face-to-face time, excluding clinical staff time, preparing to see patient, ordering tests and/or medications, counseling the patient, and independently interpreting results and communicating results to the patient/family/caregiver    Henreitta Leber, MD Medical Director of Neuro-Oncology [facility] 02/25/23 10:49 AM

## 2023-03-06 ENCOUNTER — Other Ambulatory Visit: Payer: Self-pay | Admitting: Radiation Therapy

## 2023-03-14 DIAGNOSIS — Z9889 Other specified postprocedural states: Secondary | ICD-10-CM | POA: Diagnosis not present

## 2023-03-14 DIAGNOSIS — N201 Calculus of ureter: Secondary | ICD-10-CM | POA: Diagnosis not present

## 2023-03-14 DIAGNOSIS — E119 Type 2 diabetes mellitus without complications: Secondary | ICD-10-CM | POA: Diagnosis not present

## 2023-03-14 DIAGNOSIS — R109 Unspecified abdominal pain: Secondary | ICD-10-CM | POA: Diagnosis not present

## 2023-03-14 DIAGNOSIS — Z7984 Long term (current) use of oral hypoglycemic drugs: Secondary | ICD-10-CM | POA: Diagnosis not present

## 2023-03-14 DIAGNOSIS — I10 Essential (primary) hypertension: Secondary | ICD-10-CM | POA: Diagnosis not present

## 2023-03-14 DIAGNOSIS — R112 Nausea with vomiting, unspecified: Secondary | ICD-10-CM | POA: Diagnosis not present

## 2023-03-14 DIAGNOSIS — Z793 Long term (current) use of hormonal contraceptives: Secondary | ICD-10-CM | POA: Diagnosis not present

## 2023-03-14 DIAGNOSIS — N202 Calculus of kidney with calculus of ureter: Secondary | ICD-10-CM | POA: Diagnosis not present

## 2023-03-14 DIAGNOSIS — Z79899 Other long term (current) drug therapy: Secondary | ICD-10-CM | POA: Diagnosis not present

## 2023-03-20 DIAGNOSIS — R102 Pelvic and perineal pain: Secondary | ICD-10-CM | POA: Diagnosis not present

## 2023-03-20 DIAGNOSIS — Z8489 Family history of other specified conditions: Secondary | ICD-10-CM | POA: Diagnosis not present

## 2023-03-26 DIAGNOSIS — R944 Abnormal results of kidney function studies: Secondary | ICD-10-CM | POA: Diagnosis not present

## 2023-03-26 DIAGNOSIS — N200 Calculus of kidney: Secondary | ICD-10-CM | POA: Diagnosis not present

## 2023-04-04 DIAGNOSIS — N201 Calculus of ureter: Secondary | ICD-10-CM | POA: Diagnosis not present

## 2023-04-04 DIAGNOSIS — N202 Calculus of kidney with calculus of ureter: Secondary | ICD-10-CM | POA: Diagnosis not present

## 2023-04-04 DIAGNOSIS — E785 Hyperlipidemia, unspecified: Secondary | ICD-10-CM | POA: Diagnosis not present

## 2023-04-04 DIAGNOSIS — Z7985 Long-term (current) use of injectable non-insulin antidiabetic drugs: Secondary | ICD-10-CM | POA: Diagnosis not present

## 2023-04-04 DIAGNOSIS — Z79899 Other long term (current) drug therapy: Secondary | ICD-10-CM | POA: Diagnosis not present

## 2023-04-04 DIAGNOSIS — E1129 Type 2 diabetes mellitus with other diabetic kidney complication: Secondary | ICD-10-CM | POA: Diagnosis not present

## 2023-04-04 DIAGNOSIS — I10 Essential (primary) hypertension: Secondary | ICD-10-CM | POA: Diagnosis not present

## 2023-04-04 DIAGNOSIS — E119 Type 2 diabetes mellitus without complications: Secondary | ICD-10-CM | POA: Diagnosis not present

## 2023-04-04 DIAGNOSIS — K219 Gastro-esophageal reflux disease without esophagitis: Secondary | ICD-10-CM | POA: Diagnosis not present

## 2023-04-10 ENCOUNTER — Other Ambulatory Visit: Payer: Self-pay | Admitting: Internal Medicine

## 2023-04-17 DIAGNOSIS — Z7985 Long-term (current) use of injectable non-insulin antidiabetic drugs: Secondary | ICD-10-CM | POA: Diagnosis not present

## 2023-04-17 DIAGNOSIS — E1165 Type 2 diabetes mellitus with hyperglycemia: Secondary | ICD-10-CM | POA: Diagnosis not present

## 2023-04-17 DIAGNOSIS — Z960 Presence of urogenital implants: Secondary | ICD-10-CM | POA: Diagnosis not present

## 2023-04-17 DIAGNOSIS — Z87442 Personal history of urinary calculi: Secondary | ICD-10-CM | POA: Diagnosis not present

## 2023-04-17 DIAGNOSIS — R112 Nausea with vomiting, unspecified: Secondary | ICD-10-CM | POA: Diagnosis not present

## 2023-04-17 DIAGNOSIS — D259 Leiomyoma of uterus, unspecified: Secondary | ICD-10-CM | POA: Diagnosis not present

## 2023-04-17 DIAGNOSIS — E785 Hyperlipidemia, unspecified: Secondary | ICD-10-CM | POA: Diagnosis not present

## 2023-04-17 DIAGNOSIS — Z888 Allergy status to other drugs, medicaments and biological substances status: Secondary | ICD-10-CM | POA: Diagnosis not present

## 2023-04-17 DIAGNOSIS — E1159 Type 2 diabetes mellitus with other circulatory complications: Secondary | ICD-10-CM | POA: Diagnosis not present

## 2023-04-17 DIAGNOSIS — Z9109 Other allergy status, other than to drugs and biological substances: Secondary | ICD-10-CM | POA: Diagnosis not present

## 2023-04-17 DIAGNOSIS — K219 Gastro-esophageal reflux disease without esophagitis: Secondary | ICD-10-CM | POA: Diagnosis not present

## 2023-04-17 DIAGNOSIS — G43909 Migraine, unspecified, not intractable, without status migrainosus: Secondary | ICD-10-CM | POA: Diagnosis not present

## 2023-04-17 DIAGNOSIS — R109 Unspecified abdominal pain: Secondary | ICD-10-CM | POA: Diagnosis not present

## 2023-04-17 DIAGNOSIS — R1013 Epigastric pain: Secondary | ICD-10-CM | POA: Diagnosis not present

## 2023-04-17 DIAGNOSIS — R000 Tachycardia, unspecified: Secondary | ICD-10-CM | POA: Diagnosis not present

## 2023-04-17 DIAGNOSIS — I10 Essential (primary) hypertension: Secondary | ICD-10-CM | POA: Diagnosis not present

## 2023-04-17 DIAGNOSIS — R911 Solitary pulmonary nodule: Secondary | ICD-10-CM | POA: Diagnosis not present

## 2023-04-17 DIAGNOSIS — Z90710 Acquired absence of both cervix and uterus: Secondary | ICD-10-CM | POA: Diagnosis not present

## 2023-04-18 ENCOUNTER — Other Ambulatory Visit: Payer: Self-pay | Admitting: Oncology

## 2023-04-18 DIAGNOSIS — Z794 Long term (current) use of insulin: Secondary | ICD-10-CM | POA: Diagnosis not present

## 2023-04-18 DIAGNOSIS — Z960 Presence of urogenital implants: Secondary | ICD-10-CM | POA: Diagnosis not present

## 2023-04-18 DIAGNOSIS — E119 Type 2 diabetes mellitus without complications: Secondary | ICD-10-CM | POA: Diagnosis not present

## 2023-04-18 DIAGNOSIS — N200 Calculus of kidney: Secondary | ICD-10-CM | POA: Diagnosis not present

## 2023-04-18 DIAGNOSIS — Z006 Encounter for examination for normal comparison and control in clinical research program: Secondary | ICD-10-CM

## 2023-04-23 DIAGNOSIS — B3731 Acute candidiasis of vulva and vagina: Secondary | ICD-10-CM | POA: Diagnosis not present

## 2023-05-01 DIAGNOSIS — Z794 Long term (current) use of insulin: Secondary | ICD-10-CM | POA: Diagnosis not present

## 2023-05-01 DIAGNOSIS — E119 Type 2 diabetes mellitus without complications: Secondary | ICD-10-CM | POA: Diagnosis not present

## 2023-05-04 DIAGNOSIS — E1129 Type 2 diabetes mellitus with other diabetic kidney complication: Secondary | ICD-10-CM | POA: Diagnosis not present

## 2023-05-23 DIAGNOSIS — Z1331 Encounter for screening for depression: Secondary | ICD-10-CM | POA: Diagnosis not present

## 2023-05-23 DIAGNOSIS — I10 Essential (primary) hypertension: Secondary | ICD-10-CM | POA: Diagnosis not present

## 2023-05-23 DIAGNOSIS — Z79899 Other long term (current) drug therapy: Secondary | ICD-10-CM | POA: Diagnosis not present

## 2023-05-23 DIAGNOSIS — Z0000 Encounter for general adult medical examination without abnormal findings: Secondary | ICD-10-CM | POA: Diagnosis not present

## 2023-05-31 DIAGNOSIS — N200 Calculus of kidney: Secondary | ICD-10-CM | POA: Diagnosis not present

## 2023-05-31 DIAGNOSIS — R3129 Other microscopic hematuria: Secondary | ICD-10-CM | POA: Diagnosis not present

## 2023-06-03 DIAGNOSIS — E1129 Type 2 diabetes mellitus with other diabetic kidney complication: Secondary | ICD-10-CM | POA: Diagnosis not present

## 2023-06-05 DIAGNOSIS — N1832 Chronic kidney disease, stage 3b: Secondary | ICD-10-CM | POA: Diagnosis not present

## 2023-06-05 DIAGNOSIS — Z23 Encounter for immunization: Secondary | ICD-10-CM | POA: Diagnosis not present

## 2023-06-05 DIAGNOSIS — N1831 Chronic kidney disease, stage 3a: Secondary | ICD-10-CM | POA: Diagnosis not present

## 2023-06-05 DIAGNOSIS — Z79899 Other long term (current) drug therapy: Secondary | ICD-10-CM | POA: Diagnosis not present

## 2023-06-14 DIAGNOSIS — R92323 Mammographic fibroglandular density, bilateral breasts: Secondary | ICD-10-CM | POA: Diagnosis not present

## 2023-06-14 DIAGNOSIS — Z1231 Encounter for screening mammogram for malignant neoplasm of breast: Secondary | ICD-10-CM | POA: Diagnosis not present

## 2023-06-15 ENCOUNTER — Other Ambulatory Visit: Payer: BC Managed Care – PPO

## 2023-06-17 ENCOUNTER — Other Ambulatory Visit: Payer: Self-pay | Admitting: Internal Medicine

## 2023-06-27 DIAGNOSIS — E785 Hyperlipidemia, unspecified: Secondary | ICD-10-CM | POA: Diagnosis not present

## 2023-06-27 DIAGNOSIS — E042 Nontoxic multinodular goiter: Secondary | ICD-10-CM | POA: Diagnosis not present

## 2023-06-27 DIAGNOSIS — R7989 Other specified abnormal findings of blood chemistry: Secondary | ICD-10-CM | POA: Diagnosis not present

## 2023-06-27 DIAGNOSIS — E1169 Type 2 diabetes mellitus with other specified complication: Secondary | ICD-10-CM | POA: Diagnosis not present

## 2023-07-10 DIAGNOSIS — E119 Type 2 diabetes mellitus without complications: Secondary | ICD-10-CM | POA: Diagnosis not present

## 2023-07-10 DIAGNOSIS — S66510A Strain of intrinsic muscle, fascia and tendon of right index finger at wrist and hand level, initial encounter: Secondary | ICD-10-CM | POA: Diagnosis not present

## 2023-07-13 ENCOUNTER — Other Ambulatory Visit: Payer: Self-pay | Attending: Oncology

## 2023-07-19 DIAGNOSIS — E119 Type 2 diabetes mellitus without complications: Secondary | ICD-10-CM | POA: Diagnosis not present

## 2023-07-23 DIAGNOSIS — E041 Nontoxic single thyroid nodule: Secondary | ICD-10-CM | POA: Diagnosis not present

## 2023-07-23 DIAGNOSIS — E042 Nontoxic multinodular goiter: Secondary | ICD-10-CM | POA: Diagnosis not present

## 2023-07-29 DIAGNOSIS — E1169 Type 2 diabetes mellitus with other specified complication: Secondary | ICD-10-CM | POA: Diagnosis not present

## 2023-07-29 DIAGNOSIS — Z794 Long term (current) use of insulin: Secondary | ICD-10-CM | POA: Diagnosis not present

## 2023-07-29 DIAGNOSIS — E782 Mixed hyperlipidemia: Secondary | ICD-10-CM | POA: Diagnosis not present

## 2023-08-11 ENCOUNTER — Other Ambulatory Visit: Payer: Self-pay | Admitting: Internal Medicine

## 2023-09-30 ENCOUNTER — Encounter: Payer: Self-pay | Admitting: Internal Medicine

## 2023-10-07 ENCOUNTER — Inpatient Hospital Stay: Payer: BC Managed Care – PPO | Attending: Internal Medicine | Admitting: Internal Medicine

## 2023-10-07 VITALS — BP 136/93 | HR 98 | Temp 96.7°F | Resp 16 | Wt 200.5 lb

## 2023-10-07 DIAGNOSIS — G43909 Migraine, unspecified, not intractable, without status migrainosus: Secondary | ICD-10-CM | POA: Diagnosis not present

## 2023-10-07 DIAGNOSIS — G4489 Other headache syndrome: Secondary | ICD-10-CM | POA: Insufficient documentation

## 2023-10-07 DIAGNOSIS — D329 Benign neoplasm of meninges, unspecified: Secondary | ICD-10-CM | POA: Diagnosis not present

## 2023-10-07 MED ORDER — PREDNISONE 50 MG PO TABS: 50.0000 mg | ORAL_TABLET | Freq: Every day | ORAL | 0 refills | Status: DC

## 2023-10-07 NOTE — Progress Notes (Signed)
 [facility] [address]  [place], [place] [address] [phone]   Interval Evaluation  Date of Service: 10/07/23 Patient Name: Tricia Soto Patient MRN: [id number] Patient DOB: [birth year] Provider: Henreitta Leber, MD  Identifying Statement:  Tricia Soto is a 48 y.o. 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 Maurice Small 07/30/19: SRS to residual tumor Mitzi Hansen)  Interval History:  Tricia Soto presents today for clinical follow up.  She describes new onset pain episodes, with discomfort located behind her left year.  Frequency is near daily, duration typically hours.  It does not radiate to other parts of the head or face.  Vision has been stable.  Migraine type headaches remain sporadic and self limited.  Has had few episodes of dizziness which were self limited.  Denies seizures.  H+P (08/03/19) Patient presents today for follow up after having completed radiation therapy last week.  She initially began experiencing left sided head/facial pain episodes in September 2020, different in nature from her established migraine headache syndrome.  After no relief from traditional pain medications, she obtained as CT (and later MRI) which demonstrated mass abutting the clivus and brainstem.  She went for resection with Dr. Maurice Small on 06/23/19, after which she began to experience double vision.  Double vision, which is persistent to today, is described as "seeing objects side by side when looking straight ahead, worse to the left".  In addition she has experienced some facial numbness, although this is not dense or consistent.  Finally, she feels her left ear hearing was poor following surgery, but this has improved considerably, nearly to baseline.  No issues with radiation.  Currently she is concerned about her ability to drive and return to work given her visual  impairments.  Medications: Current Outpatient Medications on File Prior to Visit  Medication Sig Dispense Refill   amitriptyline (ELAVIL) 50 MG tablet TAKE 1 TABLET BY MOUTH ONCE DAILY AT BEDTIME 60 tablet 0   atorvastatin (LIPITOR) 10 MG tablet Take 10 mg by mouth daily.     gabapentin (NEURONTIN) 300 MG capsule Take 300-900 mg by mouth at bedtime as needed. (Patient not taking: Reported on 02/25/2023)     losartan (COZAAR) 25 MG tablet Take 25 mg by mouth every evening.     MOUNJARO 15 MG/0.5ML Pen Inject 15 mg into the skin once a week.     norethindrone (AYGESTIN) 5 MG tablet Take 5 mg by mouth daily.     ondansetron (ZOFRAN ODT) 4 MG disintegrating tablet Take 1 tablet (4 mg total) by mouth every 8 (eight) hours as needed for nausea or vomiting. (Patient not taking: Reported on 02/22/2022) 20 tablet 0   No current facility-administered medications on file prior to visit.    Allergies:  Allergies  Allergen Reactions   Lisinopril Cough and Other (See Comments)   Tape Other (See Comments)    Electrodes used for EKG. Leaves "marks" on the skin. Patient told to be careful using bandages. Ok to use Paper Tape.    Past Medical History:  Past Medical History:  Diagnosis Date   Chronic headaches    Diabetes mellitus without complication (HCC)    GERD (gastroesophageal reflux disease)    Hypertension    Meningioma (HCC)    Past Surgical History:  Past Surgical History:  Procedure Laterality Date   APPLICATION OF CRANIAL NAVIGATION N/A 06/23/2019   Procedure:  APPLICATION OF CRANIAL NAVIGATION;  Surgeon: Jadene Pierini, MD;  Location: [facility] OR;  Service: Neurosurgery;  Laterality: N/A;   CRANIOTOMY Left 06/23/2019   Procedure: Left craniotomy for tumor resection with brainlab/facial nerve monitoring;  Surgeon: Jadene Pierini, MD;  Location: [facility] OR;  Service: Neurosurgery;  Laterality: Left;   THYROIDECTOMY, PARTIAL     TUBAL LIGATION     1997   tubal ligation reversal  2013    Social History:  Social History   Socioeconomic History   Marital status: Married    Spouse name: Not on file   Number of children: Not on file   Years of education: Not on file   Highest education level: Not on file  Occupational History   Not on file  Tobacco Use   Smoking status: Never   Smokeless tobacco: Never  Vaping Use   Vaping status: Never Used  Substance and Sexual Activity   Alcohol use: Not Currently   Drug use: Never   Sexual activity: Not on file  Other Topics Concern   Not on file  Social History Narrative   Not on file   Social Drivers of Health   Financial Resource Strain: Low Risk  (01/28/2023)   Received from [facility], [facility]   Overall Financial Resource Strain (CARDIA)    Difficulty of Paying Living Expenses: Not very hard  Food Insecurity: No Food Insecurity (01/28/2023)   Received from [facility], [facility]   Hunger Vital Sign    Worried About Running Out of Food in the Last Year: Never true    Ran Out of Food in the Last Year: Never true  Transportation Needs: No Transportation Needs (01/28/2023)   Received from [facility], [facility]   [facility] - Transportation    In the past 12 months, has lack of transportation kept you from medical appointments or from getting medications?: No    Lack of Transportation (Non-Medical): No  Physical Activity: Not on file  Stress: Not on file  Social Connections: Unknown (12/26/2021)   Received from [facility], [facility]   Social Network    Social Network: Not on file  Intimate Partner Violence: Unknown (11/17/2021)   Received from [facility], [facility]   HITS    Physically Hurt: Not on file    Insult or Talk Down To: Not on file    Threaten Physical Harm: Not on file    Scream or Curse: Not on file   Family History: No family history on file.  Review of  Systems: Constitutional: Denies fevers, chills or abnormal weight loss Eyes: Denies blurriness of vision Ears, nose, mouth, throat, and face: Denies mucositis or sore throat Respiratory: Denies cough, dyspnea or wheezes Cardiovascular: Denies palpitation, chest discomfort or lower extremity swelling Gastrointestinal:  Denies nausea, constipation, diarrhea GU: Denies dysuria or incontinence Skin: Denies abnormal skin rashes Neurological: Per HPI Musculoskeletal: Denies joint pain, back or neck discomfort. No decrease in ROM Behavioral/Psych: Denies anxiety, disturbance in thought content, and mood instability  Physical Exam: Vitals:   10/07/23 1201  BP: (!) 136/93  Pulse: 98  Resp: 16  Temp: (!) 96.7 F (35.9 C)  SpO2: 100%    KPS: 90. General: Alert, cooperative, pleasant, in no acute distress Head: Normal EENT: No conjunctival injection or scleral icterus. Oral mucosa moist Lungs: Resp effort normal Cardiac: Regular rate and rhythm Abdomen:  non-distended abdomen Skin: No rashes cyanosis  or petechiae. Extremities: No clubbing or edema  Neurologic Exam: Mental Status: Awake, alert, attentive to examiner. Oriented to self and environment. Language is fluent with intact comprehension.  Cranial Nerves: Visual acuity is grossly normal. Visual fields are full. Left CNVI paresis. No ptosis. Face is symmetric. Motor: Tone and bulk are normal. Power is full in both arms and legs. Reflexes are symmetric, no pathologic reflexes present. Sensory: Intact to light touch and temperature Gait: Normal   Labs: I have reviewed the data as listed    Component Value Date/Time   NA 135 12/29/2020 1946   K 3.5 12/29/2020 1946   CL 104 12/29/2020 1946   CO2 20 (L) 12/29/2020 1946   GLUCOSE 345 (H) 12/29/2020 1946   BUN 9 12/29/2020 1946   CREATININE 1.10 (H) 12/29/2020 1946   CALCIUM 8.8 (L) 12/29/2020 1946   PROT 8.3 (H) 10/20/2020 1228   ALBUMIN 3.8 10/20/2020 1228   AST 11 (L)  10/20/2020 1228   ALT 11 10/20/2020 1228   ALKPHOS 102 10/20/2020 1228   BILITOT 1.1 10/20/2020 1228   GFRNONAA >60 12/29/2020 1946   GFRAA >60 06/23/2019 1536   Lab Results  Component Value Date   WBC 10.1 12/29/2020   NEUTROABS 5.2 04/22/2019   HGB 11.4 (L) 12/29/2020   HCT 34.5 (L) 12/29/2020   MCV 86.3 12/29/2020   PLT 153 12/29/2020    Assessment/Plan Meningioma (HCC) [D32.9]  Tricia Soto is clinically stable today.  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  There is not discernible motor dysfunction with the nerve.  Etiology is unclear;  tumor progression, post-radiation toxicity, or separate process affecting facial nerve are all possible.    Recommended treating with prednisone 50mg  daily x7 days, then moving MRI brain up to ~1 week, previously had been scheduled for July.  She is agreeable with this.   Ok to continue Elavil for headache prevention, 50mg  HS.  For opthalmoplegia, will continue to follow with neuro-optho at [facility] as needed.   We ask that Tricia Soto return to clinic in 1-2 weeks following steroid dosing and brain MRI, or sooner as needed.  All questions were answered. The patient knows to call the clinic with any problems, questions or concerns. No barriers to learning were detected.  I have spent a total of 40 minutes of face-to-face and non-face-to-face time, excluding clinical staff time, preparing to see patient, ordering tests and/or medications, counseling the patient, and independently interpreting results and communicating results to the patient/family/caregiver    Henreitta Leber, MD Medical Director of Neuro-Oncology [facility] at [facility] 10/07/23 12:00 PM

## 2023-10-08 ENCOUNTER — Telehealth: Payer: Self-pay | Admitting: Internal Medicine

## 2023-10-08 NOTE — Telephone Encounter (Signed)
 Marland Kitchen

## 2023-10-09 ENCOUNTER — Other Ambulatory Visit: Payer: Self-pay | Admitting: Internal Medicine

## 2023-10-12 ENCOUNTER — Ambulatory Visit (HOSPITAL_COMMUNITY)
Admission: RE | Admit: 2023-10-12 | Discharge: 2023-10-12 | Disposition: A | Discharge status: Home / Self Care | Payer: BC Managed Care – PPO | Source: Ambulatory Visit | Attending: Internal Medicine | Admitting: Internal Medicine

## 2023-10-12 DIAGNOSIS — D329 Benign neoplasm of meninges, unspecified: Secondary | ICD-10-CM | POA: Insufficient documentation

## 2023-10-12 MED ORDER — GADOBUTROL 1 MMOL/ML IV SOLN
9.0000 mL | Freq: Once | INTRAVENOUS | Status: AC | PRN
  Administered 2023-10-12: 9 mL via INTRAVENOUS

## 2023-10-14 ENCOUNTER — Inpatient Hospital Stay: Payer: BC Managed Care – PPO | Attending: Internal Medicine | Admitting: Internal Medicine

## 2023-10-14 VITALS — BP 146/87 | HR 95 | Temp 97.7°F | Resp 16 | Wt 208.3 lb

## 2023-10-14 DIAGNOSIS — G589 Mononeuropathy, unspecified: Secondary | ICD-10-CM | POA: Diagnosis not present

## 2023-10-14 DIAGNOSIS — D329 Benign neoplasm of meninges, unspecified: Secondary | ICD-10-CM

## 2023-10-14 DIAGNOSIS — G4489 Other headache syndrome: Secondary | ICD-10-CM | POA: Diagnosis not present

## 2023-10-14 NOTE — Progress Notes (Signed)
 [facility] at [facility] [address]  [place], [place] [address] [phone]   Interval Evaluation  Date of Service: 10/14/23 Patient Name: Tricia Soto Patient MRN: [id number] Patient DOB: 08/18/1975 Provider: Henreitta Leber, MD  Identifying Statement:  Tricia Soto is a 48 y.o. 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 Maurice Small 07/30/19: SRS to residual tumor Tricia Soto)  Interval History:  Tricia Soto presents today for clinical follow up.  She describes no improvement with the prednisone with regards to pain behind her left ear.  It remains very sensitive to touch and head position.  No issues with the MRI, no other new changes.  Prior: new onset pain episodes, with discomfort located behind her left year.  Frequency is near daily, duration typically hours.  It does not radiate to other parts of the head or face.  Vision has been stable.  Migraine type headaches remain sporadic and self limited.  Has had few episodes of dizziness which were self limited.  Denies seizures.  H+P (08/03/19) Patient presents today for follow up after having completed radiation therapy last week.  She initially began experiencing left sided head/facial pain episodes in September 2020, different in nature from her established migraine headache syndrome.  After no relief from traditional pain medications, she obtained as CT (and later MRI) which demonstrated mass abutting the clivus and brainstem.  She went for resection with Dr. Maurice Small on 06/23/19, after which she began to experience double vision.  Double vision, which is persistent to today, is described as "seeing objects side by side when looking straight ahead, worse to the left".  In addition she has experienced some facial numbness, although this is not dense or consistent.  Finally, she feels her left ear hearing was poor  following surgery, but this has improved considerably, nearly to baseline.  No issues with radiation.  Currently she is concerned about her ability to drive and return to work given her visual impairments.  Medications: Current Outpatient Medications on File Prior to Visit  Medication Sig Dispense Refill   amitriptyline (ELAVIL) 50 MG tablet TAKE 1 TABLET BY MOUTH ONCE DAILY AT BEDTIME 60 tablet 0   atorvastatin (LIPITOR) 10 MG tablet Take 10 mg by mouth daily.     gabapentin (NEURONTIN) 300 MG capsule Take 300-900 mg by mouth at bedtime as needed.     losartan (COZAAR) 25 MG tablet Take 25 mg by mouth every evening.     MOUNJARO 15 MG/0.5ML Pen Inject 15 mg into the skin once a week.     norethindrone (AYGESTIN) 5 MG tablet Take 5 mg by mouth daily.     ondansetron (ZOFRAN ODT) 4 MG disintegrating tablet Take 1 tablet (4 mg total) by mouth every 8 (eight) hours as needed for nausea or vomiting. 20 tablet 0   pioglitazone (ACTOS) 30 MG tablet Take 30 mg by mouth daily.     predniSONE (DELTASONE) 50 MG tablet Take 1 tablet (50 mg total) by mouth daily with breakfast. 7 tablet 0   No current facility-administered medications on file prior to visit.    Allergies:  Allergies  Allergen Reactions   Lisinopril Cough and Other (See Comments)   Tape Other (See Comments)    Electrodes used for EKG. Leaves "marks" on the skin. Patient told to be careful using bandages. Ok to use Paper Tape.    Past Medical History:  Past Medical History:  Diagnosis Date   Chronic headaches    Diabetes mellitus without complication (HCC)    GERD (gastroesophageal reflux disease)    Hypertension    Meningioma (HCC)    Past Surgical History:  Past Surgical History:  Procedure Laterality Date   APPLICATION OF CRANIAL NAVIGATION N/A 06/23/2019   Procedure: APPLICATION OF CRANIAL NAVIGATION;  Surgeon: Jadene Pierini, MD;  Location: [facility] OR;  Service: Neurosurgery;  Laterality: N/A;   CRANIOTOMY Left  06/23/2019   Procedure: Left craniotomy for tumor resection with brainlab/facial nerve monitoring;  Surgeon: Jadene Pierini, MD;  Location[facility] OR;  Service: Neurosurgery;  Laterality: Left;   THYROIDECTOMY, PARTIAL     TUBAL LIGATION     1997   tubal ligation reversal  2013   Social History:  Social History   Socioeconomic History   Marital status: Married    Spouse name: Not on file   Number of children: Not on file   Years of education: Not on file   Highest education level: Not on file  Occupational History   Not on file  Tobacco Use   Smoking status: Never   Smokeless tobacco: Never  Vaping Use   Vaping status: Never Used  Substance and Sexual Activity   Alcohol use: Not Currently   Drug use: Never   Sexual activity: Not on file  Other Topics Concern   Not on file  Social History Narrative   Not on file   Social Drivers of Health   Financial Resource Strain: Low Risk  (01/28/2023)   Received from [facility], [facility]   Overall Financial Resource Strain (CARDIA)    Difficulty of Paying Living Expenses: Not very hard  Food Insecurity: No Food Insecurity (01/28/2023)   Received from [facility], [facility]   Hunger Vital Sign    Worried About Running Out of Food in the Last Year: Never true    Ran Out of Food in the Last Year: Never true  Transportation Needs: No Transportation Needs (01/28/2023)   Received from [facility], [facility]   [facility] - Transportation    In the past 12 months, has lack of transportation kept you from medical appointments or from getting medications?: No    Lack of Transportation (Non-Medical): No  Physical Activity: Not on file  Stress: Not on file  Social Connections: Unknown (12/26/2021)   Received from [facility], [facility]   Social Network    Social Network: Not on file  Intimate Partner Violence: Unknown  (11/17/2021)   Received from [facility], [facility]   HITS    Physically Hurt: Not on file    Insult or Talk Down To: Not on file    Threaten Physical Harm: Not on file    Scream or Curse: Not on file   Family History: No family history on file.  Review of Systems: Constitutional: Denies fevers, chills or abnormal weight loss Eyes: Denies blurriness of vision Ears, nose, mouth, throat, and face: Denies mucositis or sore throat Respiratory: Denies cough, dyspnea or wheezes Cardiovascular: Denies palpitation, chest discomfort or lower extremity swelling Gastrointestinal:  Denies nausea, constipation, diarrhea GU: Denies dysuria or incontinence Skin: Denies abnormal skin rashes Neurological: Per HPI Musculoskeletal: Denies joint pain, back or neck discomfort. No decrease in ROM Behavioral/Psych: Denies anxiety, disturbance in thought content, and mood instability  Physical Exam: Vitals:   10/14/23 1213  BP: Marland Kitchen)  146/87  Pulse: 95  Resp: 16  Temp: 97.7 F (36.5 C)  SpO2: 98%    KPS: 90. General: Alert, cooperative, pleasant, in no acute distress Head: Normal EENT: No conjunctival injection or scleral icterus. Oral mucosa moist Lungs: Resp effort normal Cardiac: Regular rate and rhythm Abdomen:  non-distended abdomen Skin: No rashes cyanosis or petechiae. Extremities: No clubbing or edema  Neurologic Exam: Mental Status: Awake, alert, attentive to examiner. Oriented to self and environment. Language is fluent with intact comprehension.  Cranial Nerves: Visual acuity is grossly normal. Visual fields are full. Left CNVI paresis. No ptosis. Face is symmetric. Motor: Tone and bulk are normal. Power is full in both arms and legs. Reflexes are symmetric, no pathologic reflexes present. Sensory: Intact to light touch and temperature Gait: Normal   Labs: I have reviewed the data as listed    Component Value Date/Time   NA 135 12/29/2020 1946   K 3.5 12/29/2020 1946    CL 104 12/29/2020 1946   CO2 20 (L) 12/29/2020 1946   GLUCOSE 345 (H) 12/29/2020 1946   BUN 9 12/29/2020 1946   CREATININE 1.10 (H) 12/29/2020 1946   CALCIUM 8.8 (L) 12/29/2020 1946   PROT 8.3 (H) 10/20/2020 1228   ALBUMIN 3.8 10/20/2020 1228   AST 11 (L) 10/20/2020 1228   ALT 11 10/20/2020 1228   ALKPHOS 102 10/20/2020 1228   BILITOT 1.1 10/20/2020 1228   GFRNONAA >60 12/29/2020 1946   GFRAA >60 06/23/2019 1536   Lab Results  Component Value Date   WBC 10.1 12/29/2020   NEUTROABS 5.2 04/22/2019   HGB 11.4 (L) 12/29/2020   HCT 34.5 (L) 12/29/2020   MCV 86.3 12/29/2020   PLT 153 12/29/2020    Assessment/Plan Meningioma (HCC) [D32.9]  Tricia Soto is clinically stable today.  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  There is not discernible motor dysfunction with the nerve.  Etiology is unclear;  tumor progression, post-radiation toxicity, or separate process affecting facial nerve are all possible.    Steroids were ineffective.  We discussed referral to interventional pain for superficial localized nerve block.  If this is not feasible or ineffective we can consider neuropathic pain medications.  Brain MRI demonstrated stable treated meningioma.   Ok to continue Elavil for headache prevention, 50mg  HS.  For opthalmoplegia, will continue to follow with neuro-optho at [facility] as needed.   We ask that Tricia Soto return to clinic in 9 months following brain MRI, or sooner as needed.  All questions were answered. The patient knows to call the clinic with any problems, questions or concerns. No barriers to learning were detected.  I have spent a total of 30 minutes of face-to-face and non-face-to-face time, excluding clinical staff time, preparing to see patient, ordering tests and/or medications, counseling the patient, and independently interpreting results and communicating results to the patient/family/caregiver    Henreitta Leber, MD Medical Director of Neuro-Oncology [facility] at [facility] 10/14/23 2:48 PM

## 2023-10-15 ENCOUNTER — Telehealth: Payer: Self-pay | Admitting: Internal Medicine

## 2023-10-15 NOTE — Telephone Encounter (Signed)
 Marland Kitchen

## 2023-10-21 ENCOUNTER — Encounter: Payer: Self-pay | Admitting: Internal Medicine

## 2023-10-22 ENCOUNTER — Telehealth: Payer: Self-pay | Admitting: *Deleted

## 2023-10-22 NOTE — Telephone Encounter (Signed)
 Referral, patient demographic sheet & most recent office note faxed to [facility], [phone].  Fax confirmation received.

## 2023-10-25 ENCOUNTER — Encounter: Payer: Self-pay | Admitting: Internal Medicine

## 2023-10-28 ENCOUNTER — Telehealth: Payer: Self-pay | Admitting: *Deleted

## 2023-10-28 NOTE — Telephone Encounter (Signed)
 Neurosurgery referral faxed to [facility] per patient request, [phone].  Fax confirmation received.

## 2023-12-07 ENCOUNTER — Other Ambulatory Visit: Payer: Self-pay | Admitting: Internal Medicine

## 2024-02-02 ENCOUNTER — Other Ambulatory Visit: Payer: Self-pay | Admitting: Internal Medicine

## 2024-02-18 ENCOUNTER — Other Ambulatory Visit: Payer: Self-pay | Admitting: Radiation Therapy

## 2024-02-24 ENCOUNTER — Ambulatory Visit: Payer: BC Managed Care – PPO | Admitting: Internal Medicine

## 2024-04-04 ENCOUNTER — Other Ambulatory Visit: Payer: Self-pay | Admitting: Internal Medicine

## 2024-05-30 ENCOUNTER — Other Ambulatory Visit: Payer: Self-pay | Admitting: Internal Medicine

## 2024-06-02 ENCOUNTER — Other Ambulatory Visit: Payer: Self-pay | Admitting: Medical Genetics

## 2024-06-02 DIAGNOSIS — Z006 Encounter for examination for normal comparison and control in clinical research program: Secondary | ICD-10-CM

## 2024-06-17 NOTE — Progress Notes (Signed)
 This video encounter was conducted with the patient's (or proxy's) verbal consent via secure, interactive audio and video telecommunications.    The patient (or proxy) was instructed by the virtual care center staff to have this encounter in a suitably private space and to only have persons present to whom they give permission to participate. In addition, patient identity was confirmed by use of name and date of birth.   Telehealth visit was conducted with Tricia Soto via HIPAA compliant video platform. Blood Sugar: 283 Virtual Visit  Lab Results  Component Value Date   HGBA1C 7.6 (H) 04/21/2024   HGBA1C 9.3 (H) 12/03/2023    Last Meal: 8pm  Model of Meter: Dexcom  Pump model: na   Last dilated eye exam at a [facility] facility: 02/11/2023

## 2024-06-17 NOTE — Progress Notes (Signed)
 This video encounter was conducted with the patient's (or proxy's) verbal consent via secure, interactive audio and video telecommunications while away from clinic/office/hospital.  The patient (or proxy) was instructed to have this encounter in a suitably private space and to only have persons present to whom they give permission to participate. In addition, patient identity was confirmed by use of name plus two identifiers.  July 14, 2020 E&M) This visit was coded based on time. I spent a total of 30 minutes in both face-to-face and non-face-to-face activities for this visit on the date of this encounter.  Reason for Visit: Type 2 Diabetes Mellitus  Subjective:  Tricia Soto is a pleasant 48 y.o.female presenting for follow-up of type 2 diabetes.  Hx significant for meningioma with resection and radiation in 07-15-19, followed by [facility] with most recent visit in 10/2023 noting stability.  DM was diagnosed 2007-2008 when she developed weight loss and polyuria. No hospitalization for DM. No DKA.   1 mg DST done and abnormal.  She is on estrogen. Does not have sleep apnea, no smoking, no depression, no insomnia. Received methylpred injection 40 mg for facial pain on 03/27/2024. Says she has lost a few lbs and watching what she is eating, lowering carbs/sugars and walking.  She really wants to stay off insulin .  She is open to trying other oral medication.   HbA1c: 7.6% average of 175 mg/dL 0/0/7974  She is currently taking: -Mounjaro 15 mg weekly -Metformin 1500 mg evening - not able to tolerate a higher dose   Was previously taking: -Tresiba 20 units at bedtime -Novolog  7 units with meals (2 times per day mostly)   Previous diabetes medications not tolerated: -Jardiance - recurrent GU infection -Ozempic, Saxenda , Victoza  did not control blood sugars -Januvia - unsure  -Actos - swelling -glipizide - rapid weight gain  She is using Dexcom G7 CGM : [place]  [employer].     She has good  hypoglycemia awareness and knows how to treat hypoglycemia.   Diet: they do not cook  Breakfast: sausage and eggs with toast occasional or skips if she does not have time or will have a pack of crackers  Lunch: lunchable protein pack or banana / fruit  Dinner: may skip if after 7 pm or she will eat out but smaller portions Snacks: puffed corn  Beverages: Gatorade zero and water  Exercise: walking about 1 mile per day   She denies chest pain, shortness of breath, leg pain, numbness or tingling.  Family history: does not know biological family. Her daughter has pre-diabetes.   Last CDE was at diagnosis. She also receives nutrition and medication education through her husband's insurance.   In terms of cholesterol, She is taking atorvastatin 10 mg daily. Denies myalgia.   Obesity: Heaviest weight 250 lbs but before Mounjaro 230-240 lb was able to get down to 180 lbs. But then gaining on max dose of Mounjaro.  Feels she is having sugar cravings in the evening. May be doing some stress eating. She will eat 2 cinnamon sugar graham crackers.   Hx of goiter: She notes left lobectomy at [facility] around July 15, 2007 based on inconclusive thyroid  nodule biopsy. Has not required thyroid  hormone replacement.  12/2014 right thyroid  nodule FNA with benign results 12/2015 thyroid  US  done outside of [facility] reported right thyroid  1.6 x 2.0 x 2.1 cm stable hyperechoic nodule.  10/2021 US  showed 2 nodules. #1 Right mid measures 1.4 x 1.1 x 0.7 cm solid and isoechoic. #2 right mid  measures 2.0 x 1.9 x 1.9 cm solid and isoechoic. Neither have grown from previous imaging and both TR3 compared to 02/2020. 06/2023 repeat US  right inferior nodule measures 1.9 x 1.5 x 1.9 and right mid measures 1.3 x. 0.8 x 1.1. Right inferior met criteria for re-biopsy. Done and returned benign.  Does not know family history.  2020 removed meningioma and had radiation 08/2019 [facility] sees neurology yearly.   Social: Moved from  [place] to be closer to family. She lives with her husband and grown children.   Review of Systems: Completed and pertinent positives and negatives noted in above HPI.  Objective:   There were no vitals taken for this visit. Constitutional: alert, interactive with provider, cooperative, in no distress Mental status: oriented x 3, good historian, appropriate mood and behavior, thought content appears normal Respiratory: no respiratory distress, no audible wheezing  Lab Review :  Lab Results  Component Value Date   HGBA1C 7.6 (H) 04/21/2024   HGBA1C 9.3 (H) 12/03/2023   HGBA1C 8.9 (H) 07/29/2023   Lab Results  Component Value Date   MICROALBUR 984.0 12/05/2023   LDLCALC 59 05/27/2024   CREATININE 1.2 (H) 04/21/2024   Assessment:  Tricia Soto is a pleasant 48 y.o. female here for follow-up of type 2 diabetes, with improvement in HbA1c but higher sugars on CGM. C-peptide checked in 11/2023 and returned normal with a blood sugar of 199. She also has history of MNG and partial thyroidectomy (left sided), not requiring thyroid  hormone replacement.  Discussed on diabetes progression and the importance of good control to avoid complications.  Discussed on hyperglycemia and hypoglycemia management. Discussed on healthy diet and exercise.  Plan:   1. Diabetes:  - Continue Mounjaro, at max dose, injecting correctly. Does not have hx of MTC/MEN or pancreatitis. No family hx of MEN/MTC.  - Continue metformin.   - Start Prandin 0.5 mg TIDAC. Can titrate to 1 mg if needed. Discussed on SE profile. This is weight neutral and appealing to her to trial. Discussed on hypoglycemia risk.         - Continue CGM for blood sugar monitoring as long as covered.   - Advised to follow a low carbohydrate, high protein, well balanced diet. Offered to refer to RD but she declined. Advised to increase protein, add veggies, reduce carbs, protein smoothies. She has been working on this.   - Advised to  exercise regularly at least 3 times a week, as tolerated.  - Advised on hypoglycemia recognition and management.  - Respectfully declined referral for diabetes education / dietician.              Diabetes maintenance  - (-) retinopathy. Eye exam 02/2023. Recommend to update yearly.   - (-) neuropathy. Foot exam done 04/2024. Check feet regularly; wear proper foot wear.  - (+) known nephropathy: Cr 1.0, eGFR 69 05/2023, (+) albuminuria. Now followed by nephrology. On ARB; not able to tolerate SGLT2i.            -  Immunizations:-per PCP              2.  Hypertension: goal BP is <130/80   -At goal in office at last visit.  3.  Hyperlipidemia: goal LDL <70  -At goal.   4.  Hx MNG and left thyroid  lobectomy outside [facility]  - Last thyroid  US  in 06/2023 and subsequent biopsy which returned benign. Can repeat US  in 1 year.  - No new compression symptoms. -  TFTs normal in 11/2023.   5. Weight gain - 1 mg DST returned abnormal - cortisol 3.8 with therapeutic dex level. She is on estrogen which can impact CBG. Checking baseline levels and CBG then will decide next steps.  - She has increased physical activity. - Advised to work on diet adjustments and she has. - TFTs good in 11/2023   No follow-ups on file.     Attestation Statement:   I personally performed the service, non-incident to. (WP)   ANICA PEROS LAND, NP

## 2024-06-24 NOTE — Progress Notes (Signed)
 Patient Identifier:   Tricia Soto is a 48 y.o. female who has been referred by Dr. Arthor for the evaluation of chronic kidney disease.   History:   Tricia Soto presents today to the renal clinic. Patient is a 48 y.o.  with elevated creatinine in 03/2022 which subsequently has normalized to values at 1.0 mg/dL, most recently on 89/89/75.  Creatinine was as high as 1.3 mg/dL in 08/13/7974.  Patient does have very modest proteinuria based on a albumin  to creatinine ratio of 0.182 gm/gm in 05/2023.   Of note, an e-consult was done by Dr. Azalea in 09/2022. He suggested a serologic work up which included a negative UPEP and UPEP, normal complements, negative anti-DNA, neagtive ANCA, and slightly elevated kappa with normal ratio.  Patient has DM type 2 since 2008. Her current medication regimen includes Mounjaro which she has been on for nearly two years, lost nearly 60 lbs of weight on initiation. Also on metformin 500 mg BID, repaglinide 0.5mg  TID.  Was on insulin  beginning 03/2023 though she reports this was stopped iso concern that this was contributing to weight gain. She was uptitrtated to metformin 1000 mg BID though this was limited by GI. Previous trial on empagliflozin 11/2022 - 12/2022 though did not tolerate with c/o recurrent (x2-3) yeast infections on initiation of this. Since discontinuation of insulin , reports looser BG control (readings in 100s-low 200s compared to prior tight control). She defers management to PCP and will continue to follow closely for adjustments given intolerance to prior therapies as above.   She also has HTN for which she takes losartan -HCT 100-12.5 mg with strong BP control. Normotensive on presentation today.   Lastly, she has kidney stones, twice.  Last occurred 04/2023 and prior to that about 30 years ago. A stone analysis was done and it was 100% calcium oxalate. Remains well hydrated and follows with urology. Denies any recent dysuria, fever, chills, abdominal pain;  further denies recurrent kidney stone.   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  Remains active at work, typically walking 2-3 miles per day. She has 2 children, both daughters, and a stepchild.    Past Medical History:   Past Medical History:  Diagnosis Date  . Acid reflux   . Hypertension   . Multinodular goiter 09/22/2009   Formatting of this note might be different from the original. Updated invalid ICD 9 codes for ICD 10 go live    . Non-toxic nodular goiter 09/22/2009  . S/P resection of meningioma 09/28/2019  . Type 2 diabetes mellitus (CMS/HHS-HCC)     Past Surgical History:  Procedure Laterality Date  . COLONOSCOPY N/A 05/10/2022   Procedure: COLORECTAL CANCER SCREENING; COLONOSCOPY ON INDIVIDUAL NOT MEETING CRITERIA FOR HIGH RISK;  Surgeon: Geoffrey Eleanor Kubas, MD;  [facility];  Service: Gastroenterology;  Laterality: N/A;  . ENDOMETRIAL ABLATION  2023   [facility]  . surgery to remove tumor     Brain surgery  . THYROID  SURGERY     left lobectomy  . THYROIDECTOMY  2009   Left side    Patient Active Problem List  Diagnosis  . Insulin  dependent type 2 diabetes mellitus (CMS/HHS-HCC)  . Migraine headache  . Insomnia  . 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 Hyperlipidemia  . Essential hypertension  . Acute pain of right knee  .  Abducens (sixth) nerve palsy, left  . S/P resection of meningioma  . Sixth (abducent) nerve palsy, left eye  . Diplopia  . Multinodular goiter  . Low vitamin D level  . Chronic kidney disease, stage 3b (CMS-HCC)    Allergies: Allergies  Allergen Reactions  . Lisinopril Cough and Unknown  . Adhesive Other (See Comments)    Electrodes used for EKG. Leaves marks on the skin. Patient told to be careful using bandages. Ok to use Paper  Tape.     Medicationlist:   Current Outpatient Medications:  .  amitriptyline  (ELAVIL ) 50 MG tablet, Take 50 mg by mouth nightly, Disp: , Rfl:  .  atorvastatin (LIPITOR) 10 MG tablet, Take 1 tablet (10 mg total) by mouth once daily, Disp: 90 tablet, Rfl: 3 .  blood-glucose,receiver,cont (DEXCOM G7 RECEIVER MISC), Use, Disp: , Rfl:  .  cyclobenzaprine (FLEXERIL) 10 MG tablet, Take 1 tablet (10 mg total) by mouth 3 (three) times daily as needed for Muscle spasms, Disp: 100 tablet, Rfl: 3 .  eszopiclone (LUNESTA) 2 MG tablet, TAKE 1 TABLET BY MOUTH AT BEDTIME AS NEEDED FOR SLEEP FOR  UP  TO  30  DAYS.  TAKE  IMMEDIATELY  BEFORE  BEDTIME., Disp: 30 tablet, Rfl: 3 .  gabapentin  (NEURONTIN ) 300 MG capsule, Take 2 capsules (600 mg total) by mouth 3 (three) times daily, Disp: , Rfl:  .  losartan -hydroCHLOROthiazide (HYZAAR) 100-12.5 mg tablet, Take 1 tablet by mouth once daily, Disp: 90 tablet, Rfl: 0 .  metFORMIN (GLUCOPHAGE-XR) 500 MG XR tablet, Take 2 tablets (1,000 mg total) by mouth 2 (two) times daily, Disp: 360 tablet, Rfl: 3 .  norethindrone (MICRONOR) 0.35 mg tablet, Take 1 tablet by mouth once daily, Disp: 84 tablet, Rfl: 0 .  omeprazole (PRILOSEC) 20 MG DR capsule, Take 1 capsule (20 mg total) by mouth once daily, Disp: 30 capsule, Rfl: 6 .  PEN NEEDLE 32 gauge x 5/32 Ndle, Use 1 each 4 (four) times daily, Disp: 100 each, Rfl: 4 .  repaglinide (PRANDIN) 0.5 MG tablet, Take 1 tablet (0.5 mg total) by mouth 3 (three) times daily before meals, Disp: 90 tablet, Rfl: 5 .  insulin  DEGLUDEC (TRESIBA FLEXTOUCH U-100) pen injector (concentration 100 units/mL), Inject 20 Units subcutaneously at bedtime, Disp: 21 mL, Rfl: 3  Social History:  Social History   Socioeconomic History  . Marital status: Married  Occupational History  . Occupation: Child psychotherapist  Tobacco Use  . Smoking status: Never  . Smokeless tobacco: Never  . Tobacco comments:    works Administrator, [employer] Service  . Vaping  status: Never Used  Substance and Sexual Activity  . Alcohol use: Never  . Drug use: No  . Sexual activity: Yes    Partners: Male    Birth control/protection: None  Other Topics Concern  . Would you please tell us  about the people who live in your home, your pets, or anything else important to your social life? Yes  Social History Narrative   Pt lives with her husband and adult daughter. 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    Social Drivers of Corporate Investment Banker Strain: Low Risk  (04/20/2024)   Overall Financial Resource Strain (CARDIA)   . Difficulty of Paying Living Expenses: Not very hard  Food Insecurity: No Food Insecurity (04/20/2024)   Hunger Vital Sign   . Worried About Programme Researcher, Broadcasting/film/video in the Last Year: Never true   . Ran Out of Food  in the Last Year: Never true  Transportation Needs: No Transportation Needs (04/20/2024)   PRAPARE - Transportation   . Lack of Transportation (Medical): No   . Lack of Transportation (Non-Medical): No   Received from [facility]   Social Network  Housing Stability: Low Risk  (04/20/2024)   Housing Stability Vital Sign   . Unable to Pay for Housing in the Last Year: No   . Number of Times Moved in the Last Year: 1   . Homeless in the Last Year: No    Family History:  Family History  Problem Relation Name Age of Onset  . Asthma Daughter [facility]   . Glaucoma Neg Hx    . Macular degeneration Neg Hx    . Thyroid  disease Neg Hx    . Colon cancer Neg Hx    . Colon polyps Neg Hx      Review of Systems:   A comprehensive 14 system review of systems was negative except for what is mentioned in the HPI.  Additionally the patient does not complain of any urinary symptoms like hematuria, dysuria, urinary frequency, or hesitancy.    Physical Examination:    Vitals:   06/24/24 1111  BP: 124/73  BP Location: Left upper arm  Patient Position: Sitting  BP Cuff Size: Large Adult  Pulse: 93  Temp: 36.3 C (97.4 F)  TempSrc: Oral   SpO2: 100%  Weight: 94.5 kg (208 lb 5.4 oz)    General:  Alert, cooperative, no distress, appears stated age Head:  Normocephalic, without obvious abnormality, atraumatic  ENT: Oropharynx is clear Neck: Supple, symmetrical, trachea midline, no adenopathy, no carotid bruit, JVP is not elevated, JVD is not present.  Back: Symmetric, no curvature, ROM normal, no CVA tenderness Lungs: Clear to auscultation bilaterally, respirations unlabored Chest wall: Clear to auscultation bilaterally, respirations unlabored Heart:  Regular rate and rhythm, S1 and S2 normal, no murmur, rub Abdomen:  Soft, non-tender. Extremities:  There is no extremity edema Skin: No rashes as far as I can tell. Lymph nodes: No abnormal lymph nodes palpable as far as I can tell.      Data Review:   Renal function and electrolytes: Recent Labs    06/05/23 0902 12/03/23 1655 04/21/24 1535  NA 138 134* 141  K 3.8 3.2* 3.6  CL 107 101 102  CO2 23 21 24   BUN 6* 8 16  CREATININE 1.0 1.2* 1.2*  GFR 69 55 55  GLUCOSE 137 199* 273*    Anemia:  Recent Labs    05/23/23 1148 12/03/23 1655 05/27/24 0745  HGB 11.8 11.6* 10.3*  HCT 35.4 35.7 29.6*   Recent Labs    05/23/23 1148 12/03/23 1655 05/27/24 0745  WBC 9.8 9.2 8.3  PLT 242 200 192    Bone Metabolism:  Recent Labs    09/19/22 1217 10/29/22 1157 01/28/23 1512 05/23/23 1148 06/05/23 0902 12/03/23 1655 04/21/24 1535  CALCIUM 9.5   < > 8.9   < > 8.6* 8.7 9.5  ALB 3.7   < > 4.2   < > 3.1* 2.9* 3.6  PHOS 3.5   < > 3.0  --   --  2.4 3.8  PTH 74*  --  69  --   --   --   --   VITD 11*  --  32  --   --   --   --    < > = values in this interval not displayed.  Urine Studies:  Recent Labs    06/05/23 0854 12/05/23 0936 06/24/24 1210  TPCRERATIO 319* 402* 86  PROTEINUA 1+* 2+*  --   RBCUA 1 3  --   WBCUA 1 0  --   SQUAMEPI 1 3  --     Lipids:  Recent Labs    05/27/24 0745  CHOLTOTAL 106  TRIG 71  HDL 33  LDLCALC 59   The  ASCVD Risk score (Arnett DK, et al., 2019) failed to calculate for the following reasons:   The valid total cholesterol range is 130 to 320 mg/dL  Assessment:  Patient with CKD stage 2, based on mild proteinuria and stone disease (calcium oxalate).  Previous work up did not reveal any other etiology than DM.  Improved BP control and decreased proteinuria.  Main intervention remains BP control, her BP is well controlled s/p addition of HCTZ 12.5 mg to her standing losartan  100 mg daily.  Given presence of stone disease will maintain on this regimen.  Plan:  1) Creatinine today 1.2 mg/dL.  That is at her baseline.  2) Urine protein to creatinine ratio in spot urine with no proteinuria today.  Clearly improved.    3) Volume status: Patient is euvolemic on exam today. Blood pressure is on target. HCTZ 12.5 mg daily and Losartan  100 mg daily.  We are using Hyzaar combination.    4) Bone and mineral metabolism: PTH was 74 pg/mL in 09/2022, that is high given normal renal function and normal serum calcium.  In the setting of stone disease this will be repeated when she comes to clinic next.    5) Follow up, patient will come to clinic in 12 months or earlier if indicated.  I spent a total of 40 minutes in both face-to-face and non-face-to-face activities, excluding procedures performed, for this visit on the date of this encounter.  Ruediger Naaman Cinnamon, MD

## 2024-07-17 ENCOUNTER — Inpatient Hospital Stay: Admission: RE | Admit: 2024-07-17 | Discharge: 2024-07-17 | Attending: Internal Medicine

## 2024-07-17 DIAGNOSIS — D329 Benign neoplasm of meninges, unspecified: Secondary | ICD-10-CM

## 2024-07-17 MED ORDER — GADOPICLENOL 0.5 MMOL/ML IV SOLN
10.0000 mL | Freq: Once | INTRAVENOUS | Status: AC | PRN
  Administered 2024-07-17: 10 mL via INTRAVENOUS

## 2024-07-20 ENCOUNTER — Encounter

## 2024-07-21 ENCOUNTER — Inpatient Hospital Stay: Attending: Internal Medicine | Admitting: Internal Medicine

## 2024-07-21 VITALS — BP 134/79 | HR 74 | Temp 97.2°F | Resp 20 | Wt 207.0 lb

## 2024-07-21 DIAGNOSIS — D329 Benign neoplasm of meninges, unspecified: Secondary | ICD-10-CM | POA: Insufficient documentation

## 2024-07-21 MED ORDER — PREGABALIN 75 MG PO CAPS: 75.0000 mg | ORAL_CAPSULE | Freq: Two times a day (BID) | ORAL | 2 refills | Status: AC

## 2024-07-21 NOTE — Progress Notes (Signed)
 [facility] at [facility] [address]  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 Cheryle 07/30/19: SRS to residual tumor Valene)  Interval History:  [name] presents today for clinical follow up after recent MRI brain.  Continues to have episodes of pain behind her left ear lasting several hours, frequency is 3-4x per week.  She did end up getting a nerve block with pain team at [facility], this helped for a month or so then returned to prior state.  No issues with the MRI, no other new changes.  Prior: new onset pain episodes, with discomfort located behind her left year.  Frequency is near daily, duration typically hours.  It does not radiate to other parts of the head or face.  Vision has been stable.  Migraine type headaches remain sporadic and self limited.  Has had few episodes of dizziness which were self limited.  Denies seizures.  H+P (08/03/19) Patient presents today for follow up after having completed radiation therapy last week.  She initially began experiencing left sided head/facial pain episodes in September 2020, different in nature from her established migraine headache syndrome.  After no relief from traditional pain medications, she obtained as CT (and later MRI) which demonstrated mass abutting the clivus and brainstem.  She went for resection with Dr. Cheryle on 06/23/19, after which she began to experience double vision.  Double vision, which is persistent to today, is described as seeing objects side by side when looking straight ahead, worse to the left.  In addition she has experienced some facial  numbness, although this is not dense or consistent.  Finally, she feels her left ear hearing was poor following surgery, but this has improved considerably, nearly to baseline.  No issues with radiation.  Currently she is concerned about her ability to drive and return to work given her visual impairments.  Medications: Current Outpatient Medications on File Prior to Visit  Medication Sig Dispense Refill   amitriptyline  (ELAVIL ) 50 MG tablet TAKE 1 TABLET BY MOUTH ONCE DAILY AT BEDTIME 60 tablet 0   atorvastatin (LIPITOR) 10 MG tablet Take 10 mg by mouth daily.     gabapentin  (NEURONTIN ) 300 MG capsule Take 300-900 mg by mouth at bedtime as needed.     losartan  (COZAAR ) 25 MG tablet Take 25 mg by mouth every evening.     metFORMIN (GLUCOPHAGE-XR) 500 MG 24 hr tablet Take 1,000 mg by mouth 2 (two) times daily. (Patient taking differently: Take 1,000 mg by mouth daily.)     MOUNJARO 15 MG/0.5ML Pen Inject 15 mg into the skin once a week.     norethindrone (AYGESTIN) 5 MG tablet Take 5 mg by mouth daily.     ondansetron  (ZOFRAN  ODT) 4 MG disintegrating tablet Take 1 tablet (4 mg total) by mouth every 8 (eight) hours as needed for nausea or vomiting. 20 tablet 0   repaglinide (PRANDIN) 0.5 MG tablet Take 0.5 mg by mouth 3 (three) times daily.     No current facility-administered medications on file prior to visit.    Allergies:  Allergies  Allergen Reactions   Lisinopril Cough and Other (See Comments)   Tape Other (See  Comments)    Electrodes used for EKG. Leaves marks on the skin. Patient told to be careful using bandages. Ok to use Paper Tape.    Past Medical History:  Past Medical History:  Diagnosis Date   Chronic headaches    Diabetes mellitus without complication (HCC)    GERD (gastroesophageal reflux disease)    Hypertension    Meningioma (HCC)    Past Surgical History:  Past Surgical History:  Procedure Laterality Date   APPLICATION OF CRANIAL NAVIGATION N/A 06/23/2019    Procedure: APPLICATION OF CRANIAL NAVIGATION;  Surgeon: Cheryle Debby LABOR, MD;  Location: [facility] OR;  Service: Neurosurgery;  Laterality: N/A;   CRANIOTOMY Left 06/23/2019   Procedure: Left craniotomy for tumor resection with brainlab/facial nerve monitoring;  Surgeon: Cheryle Debby LABOR, MD;  Location: [facility] OR;  Service: Neurosurgery;  Laterality: Left;   THYROIDECTOMY, PARTIAL     TUBAL LIGATION     1997   tubal ligation reversal  2013   Social History:  Social History   Socioeconomic History   Marital status: Married    Spouse name: Not on file   Number of children: Not on file   Years of education: Not on file   Highest education level: Not on file  Occupational History   Not on file  Tobacco Use   Smoking status: Never   Smokeless tobacco: Never  Vaping Use   Vaping status: Never Used  Substance and Sexual Activity   Alcohol use: Not Currently   Drug use: Never   Sexual activity: Not on file  Other Topics Concern   Not on file  Social History Narrative   Not on file   Social Drivers of Health   Financial Resource Strain: Low Risk  (04/20/2024)   Received from [facility]   Overall Financial Resource Strain (CARDIA)    Difficulty of Paying Living Expenses: Not very hard  Food Insecurity: No Food Insecurity (04/20/2024)   Received from [facility]   Hunger Vital Sign    Within the past 12 months, you worried that your food would run out before you got the money to buy more.: Never true    Within the past 12 months, the food you bought just didn't last and you didn't have money to get more.: Never true  Transportation Needs: No Transportation Needs (04/20/2024)   Received from [facility] - Transportation    In the past 12 months, has lack of transportation kept you from medical appointments or from getting medications?: No    Lack of Transportation (Non-Medical): No  Physical Activity: Not on file  Stress: Not  on file  Social Connections: Unknown (12/26/2021)   Received from [facility]   Social Network    Social Network: Not on file  Intimate Partner Violence: Unknown (11/17/2021)   Received from [facility]   HITS    Physically Hurt: Not on file    Insult or Talk Down To: Not on file    Threaten Physical Harm: Not on file    Scream or Curse: Not on file   Family History: No family history on file.  Review of Systems: Constitutional: Denies fevers, chills or abnormal weight loss Eyes: Denies blurriness of vision Ears, nose, mouth, throat, and face: Denies mucositis or sore throat Respiratory: Denies cough, dyspnea or wheezes Cardiovascular: Denies palpitation, chest discomfort or lower extremity swelling Gastrointestinal:  Denies nausea, constipation, diarrhea GU: Denies dysuria or incontinence Skin: Denies  abnormal skin rashes Neurological: Per HPI Musculoskeletal: Denies joint pain, back or neck discomfort. No decrease in ROM Behavioral/Psych: Denies anxiety, disturbance in thought content, and mood instability  Physical Exam: Vitals:   07/21/24 1135  BP: 134/79  Pulse: 74  Resp: 20  Temp: (!) 97.2 F (36.2 C)  SpO2: 99%    KPS: 90. General: Alert, cooperative, pleasant, in no acute distress Head: Normal EENT: No conjunctival injection or scleral icterus. Oral mucosa moist Lungs: Resp effort normal Cardiac: Regular rate and rhythm Abdomen:  non-distended abdomen Skin: No rashes cyanosis or petechiae. Extremities: No clubbing or edema  Neurologic Exam: Mental Status: Awake, alert, attentive to examiner. Oriented to self and environment. Language is fluent with intact comprehension.  Cranial Nerves: Visual acuity is grossly normal. Visual fields are full. Left CNVI paresis. No ptosis. Face is symmetric. Motor: Tone and bulk are normal. Power is full in both arms and legs. Reflexes are symmetric, no pathologic reflexes present. Sensory: Intact to light touch and  temperature Gait: Normal   Labs: I have reviewed the data as listed    Component Value Date/Time   NA 135 12/29/2020 1946   K 3.5 12/29/2020 1946   CL 104 12/29/2020 1946   CO2 20 (L) 12/29/2020 1946   GLUCOSE 345 (H) 12/29/2020 1946   BUN 9 12/29/2020 1946   CREATININE 1.10 (H) 12/29/2020 1946   CALCIUM 8.8 (L) 12/29/2020 1946   PROT 8.3 (H) 10/20/2020 1228   ALBUMIN  3.8 10/20/2020 1228   AST 11 (L) 10/20/2020 1228   ALT 11 10/20/2020 1228   ALKPHOS 102 10/20/2020 1228   BILITOT 1.1 10/20/2020 1228   GFRNONAA >60 12/29/2020 1946   GFRAA >60 06/23/2019 1536   Lab Results  Component Value Date   WBC 10.1 12/29/2020   NEUTROABS 5.2 04/22/2019   HGB 11.4 (L) 12/29/2020   HCT 34.5 (L) 12/29/2020   MCV 86.3 12/29/2020   PLT 153 12/29/2020    Imaging:  [facility] Clinician Interpretation: I have personally reviewed the CNS images as listed.  My interpretation, in the context of the patient's clinical presentation, is stable disease  MR BRAIN W WO CONTRAST Result Date: 07/17/2024 EXAM: MRI BRAIN WITH AND WITHOUT CONTRAST 07/17/2024 11:39:02 AM TECHNIQUE: Multiplanar multisequence MRI of the head/brain was performed with and without the administration of intravenous contrast. COMPARISON: Brain MRI 10/12/2023 and earlier. CLINICAL HISTORY: 48 year old female with a history of left skull base meningioma. Assess treatment response. FINDINGS: BRAIN AND VENTRICLES: No acute infarct. No acute intracranial hemorrhage. No hydrocephalus. Stable flow voids. The major dural venous sinuses are enhancing and appear to be patent. Chronic nodular and mildly infiltrative left skull base meningioma with stable 9 mm component along the left posterior clinoid process (series 15 image 56), more plaquelike component tracking inferiorly from there in the prepontine cistern (series 15 image 44, 3 to 4 mm in thickness), chronically involving the anterior left porous acusticus. Mild involvement of the posterior  left cavernous sinus appears stable. No change in lesion size or configuration. No significant regional mass effect. No cerebral edema. Basilar cisterns remain patent. Underlying normal brain volume. Stable background gray and white matter signal. Scattered mild for age small nonspecific white matter T2 and FLAIR hyperintense foci. Superimposed small number of chronic micro hemorrhages scattered in the brain, for example series 9 image 59. No other abnormal intracranial enhancement or dural thickening identified. ORBITS: No acute abnormality. SINUSES: No acute abnormality. BONES AND SOFT TISSUES: Chronic left suboccipital craniotomy  or craniectomy changes are stable. Normal underlying bone marrow signal. No acute soft tissue abnormality. IMPRESSION: 1. Stable residual left skull base Meningioma, combined nodular and plaque-like components, without significant mass effect or cerebral edema. 2. No new intracranial abnormality. Electronically signed by: Helayne Hurst MD 07/17/2024 11:59 AM EST RP Workstation: [id number]    Assessment/Plan Meningioma (HCC) [D32.9]  Peterson KATHEE Molt is clinically stable today.  MRI brain demonstrates stable findings.  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  There is not discernible motor dysfunction with the nerve.  Etiology is unclear;  tumor progression, post-radiation toxicity, or separate process affecting facial nerve are all possible.    Agreeable with trial of Lyrica  75mg  BID, given lack of efficacy with gabapentin .  Ok to continue Elavil  for headache prevention, 50mg  HS.  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   We ask that Peterson KATHEE Molt return to clinic in 12 months following brain MRI, or sooner as needed.  We also will touch base via phone in 2 months to asess response to Lyrica .  All questions were answered. The patient knows to call the clinic with any problems, questions or  concerns. No barriers to learning were detected.  I have spent a total of 40 minutes of face-to-face and non-face-to-face time, excluding clinical staff time, preparing to see patient, ordering tests and/or medications, counseling the patient, and independently interpreting results and communicating results to the patient/family/caregiver    Lydiana Milley K Raisa Ditto, MD Medical Director of Neuro-Oncology [facility] at [facility] 07/21/24 11:52 AM

## 2024-08-25 ENCOUNTER — Encounter: Payer: Self-pay | Admitting: Internal Medicine

## 2024-08-26 MED ORDER — AMITRIPTYLINE HCL 50 MG PO TABS: 50.0000 mg | ORAL_TABLET | Freq: Every day | ORAL | 3 refills | Status: AC

## 2024-09-22 ENCOUNTER — Inpatient Hospital Stay: Attending: Internal Medicine | Admitting: Internal Medicine
